# Patient Record
Sex: Male | Born: 1990 | Race: Black or African American | Hispanic: No | Marital: Married | Smoking: Never smoker
Health system: Southern US, Community
[De-identification: ages and names within clinical notes are randomized; demographics above are authoritative.]

## PROBLEM LIST (undated history)

## (undated) DIAGNOSIS — T8859XA Other complications of anesthesia, initial encounter: Secondary | ICD-10-CM

## (undated) DIAGNOSIS — I4581 Long QT syndrome: Secondary | ICD-10-CM

## (undated) HISTORY — DX: Long QT syndrome: I45.81

## (undated) HISTORY — PX: CIRCUMCISION: SUR203

---
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
## 2018-03-13 ENCOUNTER — Encounter: Payer: Self-pay | Admitting: Family Medicine

## 2018-03-13 ENCOUNTER — Ambulatory Visit: Payer: Medicaid Other | Admitting: Family Medicine

## 2018-03-13 ENCOUNTER — Other Ambulatory Visit (HOSPITAL_COMMUNITY)
Admission: RE | Admit: 2018-03-13 | Discharge: 2018-03-13 | Disposition: A | Discharge status: Home / Self Care | Payer: Medicaid Other | Source: Ambulatory Visit | Attending: Family Medicine | Admitting: Family Medicine

## 2018-03-13 VITALS — BP 110/68 | HR 70 | Temp 98.5°F | Resp 18 | Ht 74.0 in | Wt 251.2 lb

## 2018-03-13 DIAGNOSIS — Z0000 Encounter for general adult medical examination without abnormal findings: Secondary | ICD-10-CM | POA: Insufficient documentation

## 2018-03-13 DIAGNOSIS — Z6834 Body mass index (BMI) 34.0-34.9, adult: Secondary | ICD-10-CM | POA: Insufficient documentation

## 2018-03-13 DIAGNOSIS — E669 Obesity, unspecified: Secondary | ICD-10-CM | POA: Insufficient documentation

## 2018-03-13 DIAGNOSIS — E6609 Other obesity due to excess calories: Secondary | ICD-10-CM | POA: Insufficient documentation

## 2018-03-13 DIAGNOSIS — Z113 Encounter for screening for infections with a predominantly sexual mode of transmission: Secondary | ICD-10-CM | POA: Insufficient documentation

## 2018-03-13 DIAGNOSIS — Z6832 Body mass index (BMI) 32.0-32.9, adult: Secondary | ICD-10-CM

## 2018-03-13 DIAGNOSIS — Z114 Encounter for screening for human immunodeficiency virus [HIV]: Secondary | ICD-10-CM

## 2018-03-13 DIAGNOSIS — R9431 Abnormal electrocardiogram [ECG] [EKG]: Secondary | ICD-10-CM | POA: Diagnosis not present

## 2018-03-13 DIAGNOSIS — Z13220 Encounter for screening for lipoid disorders: Secondary | ICD-10-CM | POA: Diagnosis not present

## 2018-03-13 HISTORY — DX: Abnormal electrocardiogram (ECG) (EKG): R94.31

## 2018-03-13 NOTE — Progress Notes (Addendum)
Name: Mathew Doyle   MRN: [id number]    DOB: 06/02/1990   Date:03/13/2018       Progress Note  Subjective  Chief Complaint  Chief Complaint  Patient presents with  . Establish Care  . Annual Exam    HPI  Patient presents for annual CPE.  USPSTF grade A and B recommendations:  Diet: Very poor diet.  Exercise: Works as a Advertising account executivemarble installer; otherwise no exercise  Depression:  Depression screen PHQ 2/9 03/13/2018  Decreased Interest 0  Down, Depressed, Hopeless 0  PHQ - 2 Score 0  Altered sleeping 0  Tired, decreased energy 0  Change in appetite 0  Feeling bad or failure about yourself  0  Trouble concentrating 0  Moving slowly or fidgety/restless 0  Suicidal thoughts 0  PHQ-9 Score 0  Difficult doing work/chores Not difficult at all   Hypertension:  BP Readings from Last 3 Encounters:  03/13/18 110/68   Obesity: Wt Readings from Last 3 Encounters:  03/13/18 251 lb 3.2 oz (113.9 kg)   BMI Readings from Last 3 Encounters:  03/13/18 32.25 kg/m    Lipids & Glucose: We will check today    [facility]  AUDIT-C Score  1     Married STD testing and prevention (HIV/chl/gon/syphilis): No new sexual partners in the last year.  We will check today. Hep C: We will check today  Skin cancer: No concerns Colorectal cancer: Denies family or personal history of colorectal cancer, no changes in BM's - no blood in stool, dark and tarry stool, mucus in stool, or constipation/diarrhea. Prostate cancer: Maternal grandfather. Testicular Cancer: No family history; no personal history  Lung cancer: Never smoker; Low Dose CT Chest recommended if Age 27-80 years, 30 pack-year currently smoking OR have quit w/in 15years. Patient does not qualify.   AAA: Not indicated The USPSTF recommends one-time screening with ultrasonography in men ages 6465 to 4975 years who have ever smoked  ECG: Has hx Long QT syndrome with onset in gradeschool -  was taking medication for this as a child, but stopped several years ago.  He reports intermittent LEFT sided chest pain - no particular pattern since grade school as well.  Episodes have worsened slightly over the last few months.  Has not been to see cardiologist in many years.  Advanced Care Planning: A voluntary discussion about advance care planning including the explanation and discussion of advance directives.  Discussed health care proxy and Living will, and the patient was able to identify a health care proxy as Loney LaurenceChristine Reine (Wife).  Patient does not have a living will at present time. If patient does have living will, I have requested they bring this to the clinic to be scanned in to their chart.  There are no active problems to display for this patient.   Past Surgical History:  Procedure Laterality Date  . CIRCUMCISION    . MANDIBLE FRACTURE SURGERY  2010    Family History  Problem Relation Age of Onset  . Long QT syndrome Father   . Heart disease Father   . Prostate cancer Maternal Grandfather     Social History   Socioeconomic History  . Marital status: Married    Spouse name: christine  . Number of children: 2  . Years of education: Not on file  . Highest education level: Not on file  Occupational History  . Occupation: Advertising account executivemarble installer  Social Needs  . Financial resource strain: Not  hard at all  . Food insecurity:    Worry: Never true    Inability: Never true  . Transportation needs:    Medical: No    Non-medical: No  Tobacco Use  . Smoking status: Former Smoker    Types: Cigarettes  . Smokeless tobacco: Never Used  Substance and Sexual Activity  . Alcohol use: Yes    Comment: occasional  . Drug use: Never  . Sexual activity: Yes    Partners: Female  Lifestyle  . Physical activity:    Days per week: 0 days    Minutes per session: 0 min  . Stress: Not at all  Relationships  . Social connections:    Talks on phone: More than three times a week     Gets together: More than three times a week    Attends religious service: Never    Active member of club or organization: No    Attends meetings of clubs or organizations: Never    Relationship status: Married  . Intimate partner violence:    Fear of current or ex partner: No    Emotionally abused: No    Physically abused: No    Forced sexual activity: No  Other Topics Concern  . Not on file  Social History Narrative  . Not on file    No current outpatient medications on file.  No Known Allergies   ROS  Constitutional: Negative for fever or weight change.  Respiratory: Negative for cough and shortness of breath.   Cardiovascular: See HPI  Gastrointestinal: Negative for abdominal pain, no bowel changes.  Musculoskeletal: Negative for gait problem or joint swelling.  Skin: Negative for rash.  Neurological: Negative for dizziness or headache.  No other specific complaints in a complete review of systems (except as listed in HPI above).   Objective  Vitals:   03/13/18 0919  BP: 110/68  Pulse: 70  Resp: 18  Temp: 98.5 F (36.9 C)  TempSrc: Oral  SpO2: 99%  Weight: 251 lb 3.2 oz (113.9 kg)  Height: 6\' 2"  (1.88 m)    Body mass index is 32.25 kg/m.  Physical Exam  Constitutional: Patient appears well-developed and well-nourished. No distress.  HENT: Head: Normocephalic and atraumatic. Ears: B TMs ok, no erythema or effusion; Nose: Nose normal. Mouth/Throat: Oropharynx is clear and moist. No oropharyngeal exudate.  Eyes: Conjunctivae and EOM are normal. Pupils are equal, round, and reactive to light. No scleral icterus.  Neck: Normal range of motion. Neck supple. No JVD present. No thyromegaly present.  Cardiovascular: Normal rate, regular rhythm and normal heart sounds.  No murmur heard. No BLE edema. Pulmonary/Chest: Effort normal and breath sounds normal. No respiratory distress. Abdominal: Soft. Bowel sounds are normal, no distension. There is no tenderness.  no masses MALE GENITALIA: deferred RECTAL: Deferred Musculoskeletal: Normal range of motion, no joint effusions. No gross deformities Neurological: he is alert and oriented to person, place, and time. No cranial nerve deficit. Coordination, balance, strength, speech and gait are normal.  Skin: Skin is warm and dry. No rash noted. No erythema.  Psychiatric: Patient has a normal mood and affect. behavior is normal. Judgment and thought content normal.  No results found for this or any previous visit (from the past 2160 hour(s)).  PHQ2/9: Depression screen [facility] 2/9 03/13/2018  Decreased Interest 0  Down, Depressed, Hopeless 0  PHQ - 2 Score 0  Altered sleeping 0  Tired, decreased energy 0  Change in appetite 0  Feeling bad or failure  about yourself  0  Trouble concentrating 0  Moving slowly or fidgety/restless 0  Suicidal thoughts 0  PHQ-9 Score 0  Difficult doing work/chores Not difficult at all   Fall Risk: Fall Risk  03/13/2018  Falls in the past year? 0  Number falls in past yr: 0  Injury with Fall? 0  Follow up Falls evaluation completed    Assessment & Plan  1. Annual physical exam -Prostate cancer screening and PSA options (with potential risks and benefits of testing vs not testing) were discussed along with recent recs/guidelines. -USPSTF grade A and B recommendations reviewed with patient; age-appropriate recommendations, preventive care, screening tests, etc discussed and encouraged; healthy living encouraged; see AVS for patient education given to patient -Discussed importance of 150 minutes of physical activity weekly, eat two servings of fish weekly, eat one serving of tree nuts ( cashews, pistachios, pecans, almonds.Marland Kitchen) every other day, eat 6 servings of fruit/vegetables daily and drink plenty of water and avoid sweet beverages.  - EKG 12-Lead - Lipid panel - COMPLETE METABOLIC PANEL WITH GFR; Future - Urine cytology ancillary only - HIV Antibody (routine testing w  rflx) - RPR  2. Long QT interval - EKG 12-Lead - QT is prolonged and ST segment is abnormal - we will fax to Cardiology to determine urgency of referral.  - Lipid panel - COMPLETE METABOLIC PANEL WITH GFR; Future  3. Class 1 obesity due to excess calories without serious comorbidity with body mass index (BMI) of 32.0 to 32.9 in adult - Lipid panel - COMPLETE METABOLIC PANEL WITH GFR; Future  4. Lipid screening - Lipid panel  5. Screening for HIV without presence of risk factors - HIV Antibody (routine testing w rflx)  6. Routine screening for STI (sexually transmitted infection) - Urine cytology ancillary only - RPR

## 2018-03-13 NOTE — Patient Instructions (Signed)
Preventive Care 18-39 Years, Male Preventive care refers to lifestyle choices and visits with your health care provider that can promote health and wellness. What does preventive care include?   A yearly physical exam. This is also called an annual well check.  Dental exams once or twice a year.  Routine eye exams. Ask your health care provider how often you should have your eyes checked.  Personal lifestyle choices, including: ? Daily care of your teeth and gums. ? Regular physical activity. ? Eating a healthy diet. ? Avoiding tobacco and drug use. ? Limiting alcohol use. ? Practicing safe sex. What happens during an annual well check? The services and screenings done by your health care provider during your annual well check will depend on your age, overall health, lifestyle risk factors, and family history of disease. Counseling Your health care provider may ask you questions about your:  Alcohol use.  Tobacco use.  Drug use.  Emotional well-being.  Home and relationship well-being.  Sexual activity.  Eating habits.  Work and work environment. Screening You may have the following tests or measurements:  Height, weight, and BMI.  Blood pressure.  Lipid and cholesterol levels. These may be checked every 5 years starting at age 20.  Diabetes screening. This is done by checking your blood sugar (glucose) after you have not eaten for a while (fasting).  Skin check.  Hepatitis C blood test.  Hepatitis B blood test.  Sexually transmitted disease (STD) testing. Discuss your test results, treatment options, and if necessary, the need for more tests with your health care provider. Vaccines Your health care provider may recommend certain vaccines, such as:  Influenza vaccine. This is recommended every year.  Tetanus, diphtheria, and acellular pertussis (Tdap, Td) vaccine. You may need a Td booster every 10 years.  Varicella vaccine. You may need this if you  have not been vaccinated.  HPV vaccine. If you are 26 or younger, you may need three doses over 6 months.  Measles, mumps, and rubella (MMR) vaccine. You may need at least one dose of MMR.You may also need a second dose.  Pneumococcal 13-valent conjugate (PCV13) vaccine. You may need this if you have certain conditions and have not been vaccinated.  Pneumococcal polysaccharide (PPSV23) vaccine. You may need one or two doses if you smoke cigarettes or if you have certain conditions.  Meningococcal vaccine. One dose is recommended if you are age 19-21 years and a first-year college student living in a residence hall, or if you have one of several medical conditions. You may also need additional booster doses.  Hepatitis A vaccine. You may need this if you have certain conditions or if you travel or work in places where you may be exposed to hepatitis A.  Hepatitis B vaccine. You may need this if you have certain conditions or if you travel or work in places where you may be exposed to hepatitis B.  Haemophilus influenzae type b (Hib) vaccine. You may need this if you have certain risk factors. Talk to your health care provider about which screenings and vaccines you need and how often you need them. This information is not intended to replace advice given to you by your health care provider. Make sure you discuss any questions you have with your health care provider. Document Released: 04/05/2001 Document Revised: 09/20/2016 Document Reviewed: 12/09/2014 Elsevier Interactive Patient Education  2019 Elsevier Inc.   

## 2018-03-13 NOTE — Addendum Note (Signed)
Addended by: Cynda Familia on: 03/13/2018 10:09 AM   Modules accepted: Orders

## 2018-03-14 LAB — COMPLETE METABOLIC PANEL WITH GFR
AG Ratio: 1.3 (calc) (ref 1.0–2.5)
ALT: 31 U/L (ref 9–46)
AST: 25 U/L (ref 10–40)
Albumin: 4.3 g/dL (ref 3.6–5.1)
Alkaline phosphatase (APISO): 72 U/L (ref 40–115)
BUN: 16 mg/dL (ref 7–25)
CO2: 29 mmol/L (ref 20–32)
Calcium: 9.6 mg/dL (ref 8.6–10.3)
Chloride: 105 mmol/L (ref 98–110)
Creat: 0.99 mg/dL (ref 0.60–1.35)
GFR, Est African American: 122 mL/min/{1.73_m2} (ref >=60)
GFR, Est Non African American: 105 mL/min/{1.73_m2} (ref >=60)
Globulin: 3.2 g/dL (calc) (ref 1.9–3.7)
Glucose, Bld: 84 mg/dL (ref 65–99)
POTASSIUM: 4.6 mmol/L (ref 3.5–5.3)
Sodium: 140 mmol/L (ref 135–146)
Total Bilirubin: 0.6 mg/dL (ref 0.2–1.2)
Total Protein: 7.5 g/dL (ref 6.1–8.1)

## 2018-03-14 LAB — LIPID PANEL
Cholesterol: 128 mg/dL (ref <200)
HDL: 41 mg/dL (ref >40)
LDL Cholesterol (Calc): 74 mg/dL (calc)
Non-HDL Cholesterol (Calc): 87 mg/dL (calc) (ref <130)
TRIGLYCERIDES: 51 mg/dL (ref <150)
Total CHOL/HDL Ratio: 3.1 (calc) (ref <5.0)

## 2018-03-14 LAB — HIV ANTIBODY (ROUTINE TESTING W REFLEX): HIV 1&2 Ab, 4th Generation: NONREACTIVE

## 2018-03-14 LAB — RPR: RPR: NONREACTIVE

## 2018-03-15 LAB — URINE CYTOLOGY ANCILLARY ONLY
Chlamydia: NEGATIVE
Neisseria Gonorrhea: NEGATIVE

## 2018-06-21 ENCOUNTER — Encounter: Payer: Self-pay | Admitting: Family Medicine

## 2018-06-21 ENCOUNTER — Other Ambulatory Visit: Payer: Self-pay

## 2018-06-21 ENCOUNTER — Ambulatory Visit (INDEPENDENT_AMBULATORY_CARE_PROVIDER_SITE_OTHER): Payer: Medicaid Other | Admitting: Family Medicine

## 2018-06-21 ENCOUNTER — Ambulatory Visit
Admission: EM | Admit: 2018-06-21 | Discharge: 2018-06-21 | Disposition: A | Discharge status: Home / Self Care | Payer: Medicaid Other | Attending: Family Medicine | Admitting: Family Medicine

## 2018-06-21 DIAGNOSIS — R9431 Abnormal electrocardiogram [ECG] [EKG]: Secondary | ICD-10-CM | POA: Diagnosis not present

## 2018-06-21 DIAGNOSIS — R0789 Other chest pain: Secondary | ICD-10-CM | POA: Insufficient documentation

## 2018-06-21 DIAGNOSIS — R079 Chest pain, unspecified: Secondary | ICD-10-CM

## 2018-06-21 LAB — COMPREHENSIVE METABOLIC PANEL
ALT: 24 U/L (ref 0–44)
AST: 22 U/L (ref 15–41)
Albumin: 4.6 g/dL (ref 3.5–5.0)
Alkaline Phosphatase: 75 U/L (ref 38–126)
Anion gap: 5 (ref 5–15)
BUN: 20 mg/dL (ref 6–20)
CO2: 26 mmol/L (ref 22–32)
Calcium: 9.6 mg/dL (ref 8.9–10.3)
Chloride: 107 mmol/L (ref 98–111)
Creatinine, Ser: 1.05 mg/dL (ref 0.61–1.24)
GFR calc Af Amer: >60 mL/min (ref >60)
GFR calc non Af Amer: >60 mL/min (ref >60)
Glucose, Bld: 91 mg/dL (ref 70–99)
Potassium: 3.8 mmol/L (ref 3.5–5.1)
Sodium: 138 mmol/L (ref 135–145)
Total Bilirubin: 0.4 mg/dL (ref 0.3–1.2)
Total Protein: 8.4 g/dL — ABNORMAL HIGH (ref 6.5–8.1)

## 2018-06-21 LAB — CBC WITH DIFFERENTIAL/PLATELET
Abs Immature Granulocytes: 0.01 10*3/uL (ref 0.00–0.07)
Basophils Absolute: 0.1 10*3/uL (ref 0.0–0.1)
Basophils Relative: 1 %
Eosinophils Absolute: 0 10*3/uL (ref 0.0–0.5)
Eosinophils Relative: 1 %
HCT: 43.8 % (ref 39.0–52.0)
Hemoglobin: 15.5 g/dL (ref 13.0–17.0)
Immature Granulocytes: 0 %
Lymphocytes Relative: 47 %
Lymphs Abs: 3.1 10*3/uL (ref 0.7–4.0)
MCH: 29.6 pg (ref 26.0–34.0)
MCHC: 35.4 g/dL (ref 30.0–36.0)
MCV: 83.7 fL (ref 80.0–100.0)
Monocytes Absolute: 0.5 10*3/uL (ref 0.1–1.0)
Monocytes Relative: 8 %
Neutro Abs: 2.8 10*3/uL (ref 1.7–7.7)
Neutrophils Relative %: 43 %
Platelets: 234 10*3/uL (ref 150–400)
RBC: 5.23 MIL/uL (ref 4.22–5.81)
RDW: 12.8 % (ref 11.5–15.5)
WBC: 6.4 10*3/uL (ref 4.0–10.5)
nRBC: 0 % (ref 0.0–0.2)

## 2018-06-21 LAB — TROPONIN I: Troponin I: <0.03 ng/mL (ref <0.03)

## 2018-06-21 NOTE — Progress Notes (Signed)
Name: Mathew Doyle   MRN: [id number]    DOB: [birth year]   Date:06/21/2018       Progress Note  Subjective  Chief Complaint  Chief Complaint  Patient presents with  . Chest Pain    no appetite, weak for 3 days    I connected with  Alinda Dooms  on 06/21/18 at  1:40 PM EDT by a video enabled telemedicine application and verified that I am speaking with the correct person using two identifiers.  I discussed the limitations of evaluation and management by telemedicine and the availability of in person appointments. The patient expressed understanding and agreed to proceed. Staff also discussed with the patient that there may be a patient responsible charge related to this service. Patient Location: Home Provider Location: Office Additional Individuals present: None  HPI  Pt presents with concern for chest pain for about a week.  Last visit showed long QT syndrome and abnormal ST segment, but he declined cardiology referral at that time.  He has been having diaphoresis, decreased appetite, and feeling weak, had subjective fevers.   - Chest pain is described as a "knot" that someone is squeezing inside of his chest; episodes last about a minute at a time - has several episodes a day, he is not being woken at night.  - Denies cough, shortness of breath. - Has tried Tylenol and this helped with feeling feverish, but not with anything else. 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 - Overall course is the same or worsening.  Patient Active Problem List   Diagnosis Date Noted  . 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 Class 1 obesity due to excess calories without serious comorbidity with body mass index (BMI) of 32.0 to 32.9 in adult 03/13/2018  . Lipid screening 03/13/2018    Social History   Tobacco Use  . Smoking status: Former Smoker    Types: Cigarettes  . Smokeless tobacco: Never Used  Substance Use Topics  . Alcohol use: Yes    Comment: occasional     No current outpatient medications on file.  No Known Allergies  I personally reviewed active problem list, medication list, allergies, notes from last encounter, lab results with the patient/caregiver today.  ROS  Ten systems reviewed and is negative except as mentioned in HPI   Objective  Virtual encounter, vitals not obtained.  There is no height or weight on file to calculate BMI.  Nursing Note and Vital Signs reviewed.  Physical Exam  Constitutional: Patient appears well-developed and well-nourished. No distress.  HENT: Head: Normocephalic and atraumatic.  Neck: Normal range of motion. Pulmonary/Chest: Effort normal. No respiratory distress. Speaking in complete sentences Neurological: Pt is alert and oriented to person, place, and time. Coordination, speech and gait are normal.  Psychiatric: Patient has a normal mood and affect. behavior is normal. Judgment and thought content normal.  No results found for this or any previous visit (from the past 72 hour(s)).  Assessment & Plan  1. Long QT interval - Ambulatory referral to Cardiology  2. Chest pain, unspecified type - Strongly recommend Urgent Care today for evaluation - hesitant at first, but willing to go.  - Ambulatory referral to Cardiology  -Red flags and when to present for emergency care or RTC including fever >101.43F, chest pain, shortness of breath, new/worsening/un-resolving symptoms, reviewed with patient at time of visit. Follow up and care instructions discussed and provided in AVS. - I discussed the assessment and treatment plan  with the patient. The patient was provided an opportunity to ask questions and all were answered. The patient agreed with the plan and demonstrated an understanding of the instructions.  I provided 21 minutes of non-face-to-face time during this encounter.  Doren CustardEmily E Sorayah Schrodt, FNP

## 2018-06-21 NOTE — Discharge Instructions (Addendum)
Labs and EKG were normal today.   Rest.  See Cardiology on Monday.  Take care  Dr. Adriana Simas

## 2018-06-21 NOTE — ED Triage Notes (Signed)
Patient complains of chest pain that has been off and on over the last week.

## 2018-06-21 NOTE — ED Provider Notes (Signed)
[facility]    CSN: [id number] Arrival date & time: 06/21/18  1513  History   Chief Complaint Chief Complaint  Patient presents with  . Chest Pain   HPI  28 year old male presents with chest pain.  Patient reports that over this past week he has had intermittent, left-sided chest pain.  Patient describes the pain as a "knot" on the left side of his chest.  Patient states that it is moderate to severe and occurs.  Lasts about a minute and then self resolves.  Patient reports that he has recently had subjective fever.  Patient endorsing diaphoresis.  He states that this lasted for a few days and then resolved.  Reports that he had an episode of shortness of breath yesterday with lifting a heavy object at work.  No other reported bouts of shortness of breath.  No cough.  Chest pain not associated with exertion.  No relieving factors.  No other reported symptoms.  No other complaints or concerns at this time.  PMH, Surgical Hx, Family Hx, Social History reviewed and updated as below.  Past Medical History:  Diagnosis Date  . Long Q-T syndrome    Patient Active Problem List   Diagnosis Date Noted  . 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 Class 1 obesity due to excess calories without serious comorbidity with body mass index (BMI) of 32.0 to 32.9 in adult 03/13/2018  . Lipid screening 03/13/2018   Past Surgical History:  Procedure Laterality Date  . CIRCUMCISION    . MANDIBLE FRACTURE SURGERY  2010    Home Medications    Prior to Admission medications   Not on File    Family History Family History  Problem Relation Age of Onset  . Long QT syndrome Father   . Heart disease Father   . Prostate cancer Maternal Grandfather     Social History Social History   Tobacco Use  . Smoking status: Former Smoker    Types: Cigarettes  . Smokeless tobacco: Never Used  Substance Use Topics  . Alcohol use: Yes    Comment: occasional  . Drug use: Never     Allergies    Patient has no known allergies.   Review of Systems Review of Systems  Constitutional: Positive for diaphoresis.       ? Fever.   Cardiovascular: Positive for chest pain.   Physical Exam Triage Vital Signs ED Triage Vitals  Enc Vitals Group     BP 06/21/18 1526 126/76     Pulse Rate 06/21/18 1526 (!) 56     Resp 06/21/18 1526 18     Temp 06/21/18 1526 98.9 F (37.2 C)     Temp Source 06/21/18 1526 Oral     SpO2 06/21/18 1526 99 %     Weight 06/21/18 1524 250 lb (113.4 kg)     Height 06/21/18 1524 6\' 1"  (1.854 m)     Head Circumference --      Peak Flow --      Pain Score 06/21/18 1523 6     Pain Loc --      Pain Edu? --      Excl. in GC? --    Updated Vital Signs BP 126/76 (BP Location: Right Arm)   Pulse (!) 56   Temp 98.9 F (37.2 C) (Oral)   Resp 18   Ht 6\' 1"  (1.854 m)   Wt 113.4 kg   SpO2 99%   BMI 32.98 kg/m   Visual Acuity Right  Eye Distance:   Left Eye Distance:   Bilateral Distance:    Right Eye Near:   Left Eye Near:    Bilateral Near:     Physical Exam Vitals signs and nursing note reviewed.  Constitutional:      General: He is not in acute distress.    Appearance: Normal appearance.  HENT:     Head: Normocephalic and atraumatic.     Mouth/Throat:     Pharynx: Oropharynx is clear. No posterior oropharyngeal erythema.  Eyes:     General: No scleral icterus.    Conjunctiva/sclera: Conjunctivae normal.  Cardiovascular:     Rate and Rhythm: Regular rhythm. Bradycardia present.  Pulmonary:     Effort: Pulmonary effort is normal.     Breath sounds: No wheezing or rales.  Chest:     Chest wall: No tenderness.  Neurological:     Mental Status: He is alert.  Psychiatric:        Mood and Affect: Mood normal.        Behavior: Behavior normal.    UC Treatments / Results  Labs (all labs ordered are listed, but only abnormal results are displayed) Labs Reviewed  COMPREHENSIVE METABOLIC PANEL - Abnormal; Notable for the following  components:      Result Value   Total Protein 8.4 (*)    All other components within normal limits  CBC WITH DIFFERENTIAL/PLATELET  TROPONIN I    EKG Interpretation: Sinus bradycardia with sinus arrhythmia, rate 57.  QTc 449.  Improved from prior EKG.  No evidence of prolonged QT at this time.  No ST or T wave changes suggestive of ischemia.  Radiology No results found.  Procedures Procedures (including critical care time)  Medications Ordered in UC Medications - No data to display  Initial Impression / Assessment and Plan / UC Course  I have reviewed the triage vital signs and the nursing notes.  Pertinent labs & imaging results that were available during my care of the patient were reviewed by me and considered in my medical decision making (see chart for details).    29 year old male presents with chest pain.  Exam unremarkable.  He is well-appearing.  Vital signs stable.  EKG unremarkable today.  Laboratory studies including troponin normal.  Advised to rest.  Patient excused from work tomorrow.  Patient has an appointment on Monday with cardiology.    Final Clinical Impressions(s) / UC Diagnoses   Final diagnoses:  Atypical chest pain     Discharge Instructions     Labs and EKG were normal today.   Rest.  See Cardiology on Monday.  Take care  Dr. Adriana Simas     ED Prescriptions    None     Controlled Substance Prescriptions [place] Controlled Substance Registry consulted? Not Applicable   Tommie Sams, DO 06/21/18 1627

## 2018-06-22 ENCOUNTER — Encounter: Payer: Self-pay | Admitting: Emergency Medicine

## 2018-08-09 ENCOUNTER — Telehealth: Payer: Medicaid Other | Admitting: Physician Assistant

## 2018-08-09 DIAGNOSIS — Z20822 Contact with and (suspected) exposure to covid-19: Secondary | ICD-10-CM

## 2018-08-09 NOTE — Progress Notes (Signed)
E-Visit for Corona Virus Screening   We do not order testing via e-visit. Giving past symptoms and concerns I recommend you call your health care provider or local health department to request and arrange formal testing. Many health care providers can now test patients at their office but not all are.  Please quarantine yourself while awaiting your test results.  [facility] [phone], [facility] [phone], [facility] [phone] or visit [email or web address]     COVID-19 is a respiratory illness with symptoms that are similar to the flu. Symptoms are typically mild to moderate, but there have been cases of severe illness and death due to the virus. The following symptoms may appear 2-14 days after exposure: . Fever . Cough . Shortness of breath or difficulty breathing . Chills . Repeated shaking with chills . Muscle pain . Headache . Sore throat . New loss of taste or smell . Fatigue . Congestion or runny nose . Nausea or vomiting . Diarrhea  It is vitally important that if you feel that you have an infection such as this virus or any other virus that you stay home and away from places where you may spread it to others.  You should self-quarantine for 14 days if you have symptoms that could potentially be coronavirus or have been in close contact a with a person diagnosed with COVID-19 within the last 2 weeks. You should avoid contact with people age 26 and older.   You should wear a mask or cloth face covering over your nose and mouth if you must be around other people or animals, including pets (even at home). Try to stay at least 6 feet away from other people. This will protect the people around you.   You may also take acetaminophen (Tylenol) as needed for fever.   Reduce your risk of any infection by using the same precautions used for avoiding the  common cold or flu:  Marland Kitchen. Wash your hands often with soap and warm water for at least 20 seconds.  If soap and water are not readily available, use an alcohol-based hand sanitizer with at least 60% alcohol.  . If coughing or sneezing, cover your mouth and nose by coughing or sneezing into the elbow areas of your shirt or coat, into a tissue or into your sleeve (not your hands). . Avoid shaking hands with others and consider head nods or verbal greetings only. . Avoid touching your eyes, nose, or mouth with unwashed hands.  . Avoid close contact with people who are sick. . Avoid places or events with large numbers of people in one location, like concerts or sporting events. . Carefully consider travel plans you have or are making. . If you are planning any travel outside or inside the [place], visit the CDC's Travelers' Health webpage for the latest health notices. . If you have some symptoms but not all symptoms, continue to monitor at home and seek medical attention if your symptoms worsen. . If you are having a medical emergency, call 911.  HOME CARE . Only take medications as instructed by your medical team. . Drink plenty of fluids and get plenty of rest. . A steam or ultrasonic humidifier can help if you have congestion.   GET HELP RIGHT AWAY IF YOU HAVE EMERGENCY WARNING SIGNS** FOR COVID-19. If you or someone is showing any of these signs seek emergency medical care immediately. Call 911 or proceed to your closest emergency facility if: . You develop  worsening high fever. . Trouble breathing . Bluish lips or face . Persistent pain or pressure in the chest . New confusion . Inability to wake or stay awake . You cough up blood. . Your symptoms become more severe  **This list is not all possible symptoms. Contact your medical provider for any symptoms that are sever or concerning to you.   MAKE SURE YOU   Understand these instructions.  Will watch your condition.  Will get help right  away if you are not doing well or get worse.  Your e-visit answers were reviewed by a board certified advanced clinical practitioner to complete your personal care plan.  Depending on the condition, your plan could have included both over the counter or prescription medications.  If there is a problem please reply once you have received a response from your provider.  Your safety is important to [place].  If you have drug allergies check your prescription carefully.    You can use MyChart to ask questions about today's visit, request a non-urgent call back, or ask for a work or school excuse for 24 hours related to this e-Visit. If it has been greater than 24 hours you will need to follow up with your provider, or enter a new e-Visit to address those concerns. You will get an e-mail in the next two days asking about your experience.  I hope that your e-visit has been valuable and will speed your recovery. Thank you for using e-visits.

## 2018-08-09 NOTE — Progress Notes (Signed)
I have spent 5 minutes in review of e-visit questionnaire, review and updating patient chart, medical decision making and response to patient.   Linzie Criss Cody Janeka Libman, PA-C    

## 2018-08-10 ENCOUNTER — Ambulatory Visit (HOSPITAL_COMMUNITY)
Admission: EM | Admit: 2018-08-10 | Discharge: 2018-08-10 | Disposition: A | Discharge status: Home / Self Care | Payer: Medicaid Other | Attending: Internal Medicine | Admitting: Internal Medicine

## 2018-08-10 ENCOUNTER — Other Ambulatory Visit: Payer: Medicaid Other

## 2018-08-10 ENCOUNTER — Telehealth: Payer: Self-pay | Admitting: General Practice

## 2018-08-10 ENCOUNTER — Encounter (HOSPITAL_COMMUNITY): Payer: Self-pay

## 2018-08-10 ENCOUNTER — Other Ambulatory Visit: Payer: Self-pay

## 2018-08-10 DIAGNOSIS — Z7689 Persons encountering health services in other specified circumstances: Secondary | ICD-10-CM | POA: Diagnosis not present

## 2018-08-10 DIAGNOSIS — Z20822 Contact with and (suspected) exposure to covid-19: Secondary | ICD-10-CM

## 2018-08-10 DIAGNOSIS — R6889 Other general symptoms and signs: Secondary | ICD-10-CM | POA: Diagnosis not present

## 2018-08-10 NOTE — Discharge Instructions (Signed)
We do recommend 10 days of isolation from onset of symptoms with improvement and no fever for at least three days without medications.  Due to your symptoms in presence of pandemic I do recommend Covid-19 testing. You will be called to set up an appointment time to be tested at our [facility]. Results take about 2-3 days.  Please self isolate until you receive this results.   If any worsening of symptoms please return or go to the ER.

## 2018-08-10 NOTE — Addendum Note (Signed)
Addended by: Matilde Sprang on: 08/10/2018 09:38 AM   Modules accepted: Orders

## 2018-08-10 NOTE — Telephone Encounter (Signed)
Pt has been scheduled for covid testing. Pt was referred by: Zigmund Gottron, NP

## 2018-08-10 NOTE — ED Provider Notes (Signed)
[facility]    CSN: [id number] Arrival date & time: 08/10/18  0830      History   Chief Complaint Chief Complaint  Patient presents with  . Work Note    HPI Mathew Doyle is a 28 y.o. male.   Mathew Doyle presents with complaints of recent illness. Started feeling ill 6/11 with chills and sweating, body aches, sore throat. Has felt better since 6/15, today feels much improved but needs a note to return to work. Denies fever since 6/15. No cough. No congestion. No ear pain. No rash. No gi/gu complaints. States he had a day when he felt like he had no taste but this has since returned and is normal. No loss of smell. No rash. Had taken tylenol and motrin, hasn't taken any medications today. No family members with illness. He works in [employer]. No others ill that he is aware of. Without contributing medical history.       ROS per HPI, negative if not otherwise mentioned.      Past Medical History:  Diagnosis Date  . Long Q-T syndrome     Patient Active Problem List   Diagnosis Date Noted  . 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 Class 1 obesity due to excess calories without serious comorbidity with body mass index (BMI) of 32.0 to 32.9 in adult 03/13/2018  . Lipid screening 03/13/2018    Past Surgical History:  Procedure Laterality Date  . CIRCUMCISION    . MANDIBLE FRACTURE SURGERY  2010       Home Medications    Prior to Admission medications   Not on File    Family History Family History  Problem Relation Age of Onset  . Long QT syndrome Father   . Heart disease Father   . Prostate cancer Maternal Grandfather     Social History Social History   Tobacco Use  . Smoking status: Former Smoker    Types: Cigarettes  . Smokeless tobacco: Never Used  Substance Use Topics  . Alcohol use: Yes    Comment: occasional  . Drug use: Never     Allergies   Patient has no known allergies.   Review of Systems Review of Systems   Physical  Exam Triage Vital Signs ED Triage Vitals  Enc Vitals Group     BP 08/10/18 0903 121/75     Pulse Rate 08/10/18 0903 (!) 58     Resp 08/10/18 0903 17     Temp 08/10/18 0903 99.2 F (37.3 C)     Temp Source 08/10/18 0903 Oral     SpO2 08/10/18 0903 96 %     Weight --      Height --      Head Circumference --      Peak Flow --      Pain Score 08/10/18 0905 0     Pain Loc --      Pain Edu? --      Excl. in [place]? --    No data found.  Updated Vital Signs BP 121/75 (BP Location: Left Arm)   Pulse (!) 58   Temp 99.2 F (37.3 C) (Oral)   Resp 17   SpO2 96%    Physical Exam Constitutional:      General: He is not in acute distress.    Appearance: He is well-developed. He is not ill-appearing or toxic-appearing.  Cardiovascular:     Rate and Rhythm: Normal rate.  Pulmonary:     Effort: Pulmonary effort  is normal.  Skin:    General: Skin is warm and dry.  Neurological:     Mental Status: He is alert and oriented to person, place, and time.      UC Treatments / Results  Labs (all labs ordered are listed, but only abnormal results are displayed) Labs Reviewed - No data to display  EKG None  Radiology No results found.  Procedures Procedures (including critical care time)  Medications Ordered in UC Medications - No data to display  Initial Impression / Assessment and Plan / UC Course  I have reviewed the triage vital signs and the nursing notes.  Pertinent labs & imaging results that were available during my care of the patient were reviewed by me and considered in my medical decision making (see chart for details).     Non toxic. Benign physical exam.  Recent febrile illness during covid-19 pandemic. 10 days self isolation recommended. [facility]. Continue with supportive cares. Return precautions provided. Patient verbalized understanding and agreeable to plan.   Final Clinical Impressions(s) / UC Diagnoses   Final  diagnoses:  Return to work evaluation  Suspected Covid-19 Virus Infection     Discharge Instructions     We do recommend 10 days of isolation from onset of symptoms with improvement and no fever for at least three days without medications.  Due to your symptoms in presence of pandemic I do recommend Covid-19 testing. You will be called to set up an appointment time to be [facility]. Results take about 2-3 days.  Please self isolate until you receive this results.   If any worsening of symptoms please return or go to the ER.    ED Prescriptions    None     Controlled Substance Prescriptions  Controlled Substance Registry consulted? Not Applicable   Georgetta HaberBurky,  B, NP 08/10/18 [phone]

## 2018-08-10 NOTE — Telephone Encounter (Signed)
-----   Message from Zigmund Gottron, NP sent at 08/10/2018  9:23 AM EDT ----- Regarding: covid testing needed

## 2018-08-10 NOTE — Telephone Encounter (Signed)
Order placed for covid testing

## 2018-08-10 NOTE — ED Triage Notes (Signed)
Pt presents for note to return to work after beinf sick for a few days.

## 2018-08-15 LAB — NOVEL CORONAVIRUS, NAA: SARS-CoV-2, NAA: NOT DETECTED

## 2018-08-20 ENCOUNTER — Other Ambulatory Visit: Payer: Self-pay

## 2018-08-20 ENCOUNTER — Encounter (HOSPITAL_COMMUNITY): Payer: Self-pay | Admitting: Emergency Medicine

## 2018-08-20 ENCOUNTER — Emergency Department (HOSPITAL_COMMUNITY)
Admission: EM | Admit: 2018-08-20 | Discharge: 2018-08-20 | Disposition: A | Discharge status: Home / Self Care | Payer: Medicaid Other | Attending: Emergency Medicine | Admitting: Emergency Medicine

## 2018-08-20 ENCOUNTER — Ambulatory Visit (HOSPITAL_COMMUNITY)
Admission: EM | Admit: 2018-08-20 | Discharge: 2018-08-20 | Disposition: A | Discharge status: Home / Self Care | Payer: Medicaid Other | Attending: Family Medicine | Admitting: Family Medicine

## 2018-08-20 DIAGNOSIS — R001 Bradycardia, unspecified: Secondary | ICD-10-CM

## 2018-08-20 DIAGNOSIS — R079 Chest pain, unspecified: Secondary | ICD-10-CM

## 2018-08-20 DIAGNOSIS — Z87891 Personal history of nicotine dependence: Secondary | ICD-10-CM | POA: Diagnosis not present

## 2018-08-20 DIAGNOSIS — R0789 Other chest pain: Secondary | ICD-10-CM

## 2018-08-20 LAB — CBC
HCT: 42.8 % (ref 39.0–52.0)
Hemoglobin: 14.6 g/dL (ref 13.0–17.0)
MCH: 28.7 pg (ref 26.0–34.0)
MCHC: 34.1 g/dL (ref 30.0–36.0)
MCV: 84.1 fL (ref 80.0–100.0)
Platelets: 302 10*3/uL (ref 150–400)
RBC: 5.09 MIL/uL (ref 4.22–5.81)
RDW: 12.3 % (ref 11.5–15.5)
WBC: 7.2 10*3/uL (ref 4.0–10.5)
nRBC: 0 % (ref 0.0–0.2)

## 2018-08-20 LAB — BASIC METABOLIC PANEL
Anion gap: 9 (ref 5–15)
BUN: 16 mg/dL (ref 6–20)
CO2: 23 mmol/L (ref 22–32)
Calcium: 9.5 mg/dL (ref 8.9–10.3)
Chloride: 106 mmol/L (ref 98–111)
Creatinine, Ser: 1.05 mg/dL (ref 0.61–1.24)
GFR calc Af Amer: >60 mL/min (ref >60)
GFR calc non Af Amer: >60 mL/min (ref >60)
Glucose, Bld: 89 mg/dL (ref 70–99)
Potassium: 4.3 mmol/L (ref 3.5–5.1)
Sodium: 138 mmol/L (ref 135–145)

## 2018-08-20 LAB — TROPONIN I (HIGH SENSITIVITY): Troponin I (High Sensitivity): <2 ng/L (ref <18)

## 2018-08-20 MED ORDER — DICLOFENAC SODIUM 1 % TD GEL: 2.0000 g | Freq: Four times a day (QID) | TRANSDERMAL | 0 refills | Status: DC | PRN

## 2018-08-20 NOTE — ED Triage Notes (Signed)
Pt sts pain in chest area after lifting something heavy today at work that is now resolved

## 2018-08-20 NOTE — ED Provider Notes (Signed)
[facility]    CSN: [id number] Arrival date & time: 08/20/18  [id number]     History   Chief Complaint Chief Complaint  Patient presents with  . Muscle Pain    HPI Mathew Doyle is a 28 y.o. male.   HPI Patient states he has a known "heart condition".  He previously lived in [place].  In [place] he states he had heart testing including a stress test.  He works in [employer].  He is usually able to exert himself without difficulty.  He had chest pain in April of this year and was seen at the emergency room.  Work-up was negative.  He followed up with cardiology.  They ordered an echocardiogram, this has not been done.  They ordered a Holter monitor.  He states that he had this done, but he never got test results.  He states that he thinks is cardiac work-up is delayed because of the COVID-19 pandemic. Today while at work he states that he was lifting wood up onto his shoulder and moving it to another area.  The exertion was moderate.  While he was doing this he developed rapid heartbeat "tachycardia".  He states that his heart felt like it was beating fast and beating hard, like it was "beating out of my chest".  He states that this caused a squeezing chest pain in the center of his chest and a feeling of shortness of breath.  He had to sit down.  He states it lasted almost 20 minutes.  He is here because he is worried about his heart.  He thinks he has some yet undiagnosed heart condition that could be dangerous.  He feels well now. Past Medical History:  Diagnosis Date  . Long Q-T syndrome     Patient Active Problem List   Diagnosis Date Noted  . 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 Class 1 obesity due to excess calories without serious comorbidity with body mass index (BMI) of 32.0 to 32.9 in adult 03/13/2018  . Lipid screening 03/13/2018    Past Surgical History:  Procedure Laterality Date  . CIRCUMCISION    . MANDIBLE FRACTURE SURGERY  2010       Home Medications     Prior to Admission medications   Medication Sig Start Date End Date Taking? Authorizing Provider  Multiple Vitamin (MULTIVITAMIN WITH MINERALS) TABS tablet Take 1 tablet by mouth daily.    [provider]    Family History Family History  Problem Relation Age of Onset  . Long QT syndrome Father   . Heart disease Father   . Prostate cancer Maternal Grandfather     Social History Social History   Tobacco Use  . Smoking status: Former Smoker    Types: Cigarettes  . Smokeless tobacco: Never Used  Substance Use Topics  . Alcohol use: Yes    Comment: occasional  . Drug use: Never     Allergies   Patient has no known allergies.   Review of Systems Review of Systems  Constitutional: Negative for chills and fever.  HENT: Negative for ear pain and sore throat.   Eyes: Negative for pain and visual disturbance.  Respiratory: Positive for shortness of breath. Negative for cough.   Cardiovascular: Positive for chest pain and palpitations. Negative for leg swelling.  Gastrointestinal: Negative for abdominal pain and vomiting.  Genitourinary: Negative for dysuria and hematuria.  Musculoskeletal: Negative for arthralgias and back pain.  Skin: Negative for color change and rash.  Neurological: Negative for seizures and syncope.  All other systems reviewed and are negative.    Physical Exam Triage Vital Signs ED Triage Vitals [08/20/18 1043]  Enc Vitals Group     BP 119/68     Pulse Rate (!) 52     Resp 18     Temp 98 F (36.7 C)     Temp Source Oral     SpO2 100 %     Weight      Height      Head Circumference      Peak Flow      Pain Score 0     Pain Loc      Pain Edu?      Excl. in [place]?    No data found.  Updated Vital Signs BP 119/68 (BP Location: Right Arm)   Pulse (!) 52   Temp 98 F (36.7 C) (Oral)   Resp 18   SpO2 100%       Physical Exam Constitutional:      General: He is not in acute distress.    Appearance: He is well-developed and  normal weight.  HENT:     Head: Normocephalic and atraumatic.     Nose: Nose normal.     Mouth/Throat:     Pharynx: No posterior oropharyngeal erythema.  Eyes:     Conjunctiva/sclera: Conjunctivae normal.     Pupils: Pupils are equal, round, and reactive to light.  Neck:     Musculoskeletal: Normal range of motion and neck supple.     Vascular: No carotid bruit.  Cardiovascular:     Rate and Rhythm: Regular rhythm. Bradycardia present.     Heart sounds: Normal heart sounds.  Pulmonary:     Effort: Pulmonary effort is normal. No respiratory distress.     Breath sounds: Normal breath sounds.  Abdominal:     General: Abdomen is flat. There is no distension.     Palpations: Abdomen is soft. There is no mass.     Tenderness: There is no abdominal tenderness.  Musculoskeletal: Normal range of motion.     Right lower leg: No edema.     Left lower leg: No edema.  Skin:    General: Skin is warm and dry.  Neurological:     Mental Status: He is alert.      UC Treatments / Results  Labs (all labs ordered are listed, but only abnormal results are displayed) Labs Reviewed - No data to display  EKG EKG shows sinus bradycardia.  Otherwise normal, without ST changes None  Radiology No results found.  Procedures Procedures (including critical care time)  Medications Ordered in UC Medications - No data to display  Initial Impression / Assessment and Plan / UC Course  I have reviewed the triage vital signs and the nursing notes.  Pertinent labs & imaging results that were available during my care of the patient were reviewed by me and considered in my medical decision making (see chart for details).     I explained that the patient need to be seen in the emergency room if he wanted more evaluation of his chest pain.  His EKG is normal.  I cannot do the blood testing that would be required to tell whether his chest pain today was cardiac related. Final Clinical Impressions(s) /  UC Diagnoses   Final diagnoses:  Chest pain, unspecified type     Discharge Instructions     Need to go to the ER  for evaluation of chest pain    ED Prescriptions    None     Controlled Substance Prescriptions [place] Controlled Substance Registry consulted? Not Applicable   Eustace MooreNelson, Lataisha Colan Sue, MD 08/20/18 [phone]

## 2018-08-20 NOTE — ED Notes (Signed)
Pt ambulates to bathroom with steady gait

## 2018-08-20 NOTE — ED Triage Notes (Addendum)
Pt went to UC today for pain in left chest after lifting something heavy.  States pain has resolved.  HR at UC was 48, sent here for further eval. Has hx of prolonged QT.

## 2018-08-20 NOTE — ED Provider Notes (Signed)
[facility] Provider Note   CSN: [id number] Arrival date & time: 08/20/18  1147    History   Chief Complaint Chief Complaint  Patient presents with  . Chest Pain    HPI Mathew Doyle is a 28 y.o. male presenting for evaluation of CP.  Patient states approximately 4 hours prior to arrival he had an episode of sharp left-sided chest pain.  It lasted for 10 to 15 minutes before resolving without intervention.  Patient reports pain was severe, brought him to his knees.  Due to the pain, he went to urgent care for further evaluation.  He sent to the ER for an abnormal EKG.  Patient states he has had cardiac work-up since he was a child due to prolonged QT.  Per chart review, patient is always bradycardic in the 50s.  Patient reports no other medical problems, he takes medications daily.  He has not followed up with cardiology since he is been in [place] or as an adult.  He denies associated shortness of breath, nausea, vomiting, or diaphoresis during the chest pain.  He denies recent fevers, chills, cough.  He denies tobacco, alcohol, or drug use.  Symptoms began while he was lifting something heavy.  Patient reports history of similar chest pain episodes, but usually not as severe.  Sometimes it occurs with exertion, but has also occurred when he has been asleep.  No recent increase in episodes of chest pain. The patient has no history of stroke, has no history of peripheral artery disease, has not smoked in the past 90 days, denies any history of treated diabetes, has no relevant family history of coronary artery disease (first degree relative at less than age 26), is not hypertensive, has no history of hypercholesterolemia and does not have an elevated BMI (>=30).   HPI  Past Medical History:  Diagnosis Date  . Long Q-T syndrome     Patient Active Problem List   Diagnosis Date Noted  . 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 Class 1 obesity due to  excess calories without serious comorbidity with body mass index (BMI) of 32.0 to 32.9 in adult 03/13/2018  . Lipid screening 03/13/2018    Past Surgical History:  Procedure Laterality Date  . CIRCUMCISION    . MANDIBLE FRACTURE SURGERY  2010        Home Medications    Prior to Admission medications   Medication Sig Start Date End Date Taking? Authorizing Provider  Multiple Vitamin (MULTIVITAMIN WITH MINERALS) TABS tablet Take 1 tablet by mouth daily.   Yes [provider]  diclofenac sodium (VOLTAREN) 1 % GEL Apply 2 g topically 4 (four) times daily as needed. 08/20/18   Breah Joa, PA-C    Family History Family History  Problem Relation Age of Onset  . Long QT syndrome Father   . Heart disease Father   . Prostate cancer Maternal Grandfather     Social History Social History   Tobacco Use  . Smoking status: Former Smoker    Types: Cigarettes  . Smokeless tobacco: Never Used  Substance Use Topics  . Alcohol use: Yes    Comment: occasional  . Drug use: Never     Allergies   Patient has no known allergies.   Review of Systems Review of Systems  Cardiovascular: Positive for chest pain.  All other systems reviewed and are negative.    Physical Exam Updated Vital Signs BP 123/76   Pulse (!) 51  Temp 98.6 F (37 C) (Oral)   Resp 19   Ht 6\' 2"  (1.88 m)   Wt 107.5 kg   SpO2 100%   BMI 30.43 kg/m   Physical Exam Vitals signs and nursing note reviewed.  Constitutional:      General: He is not in acute distress.    Appearance: He is well-developed.     Comments: Resting comfortably in the bed in no acute distress  HENT:     Head: Normocephalic and atraumatic.  Eyes:     Conjunctiva/sclera: Conjunctivae normal.     Pupils: Pupils are equal, round, and reactive to light.  Neck:     Musculoskeletal: Normal range of motion and neck supple.  Cardiovascular:     Rate and Rhythm: Normal rate and regular rhythm.  Pulmonary:     Effort:  Pulmonary effort is normal. No respiratory distress.     Breath sounds: Normal breath sounds. No wheezing.     Comments: Speaking in full sentences.  Clear lung sounds in all fields.  No tenderness palpation of the chest wall. Abdominal:     General: Bowel sounds are normal. There is no distension.     Palpations: Abdomen is soft.     Tenderness: There is no abdominal tenderness.  Musculoskeletal: Normal range of motion.     Right lower leg: No edema.     Left lower leg: No edema.     Comments: No leg pain or swelling.  Skin:    General: Skin is warm and dry.     Capillary Refill: Capillary refill takes less than 2 seconds.  Neurological:     Mental Status: He is alert and oriented to person, place, and time.      ED Treatments / Results  Labs (all labs ordered are listed, but only abnormal results are displayed) Labs Reviewed  CBC  BASIC METABOLIC PANEL  TROPONIN I (HIGH SENSITIVITY)    EKG EKG Interpretation  Date/Time:  Monday August 20 2018 11:50:46 EDT Ventricular Rate:  53 PR Interval:  128 QRS Duration: 88 QT Interval:  478 QTC Calculation: 448 R Axis:   24 Text Interpretation:  Sinus bradycardia with sinus arrhythmia Otherwise normal ECG Elevated J point. Similar to April 2020 tracing.  Confirmed by Alona BeneLong, Joshua [phone] on 08/20/2018 12:00:34 PM   Radiology No results found.  Procedures Procedures (including critical care time)  Medications Ordered in ED Medications - No data to display   Initial Impression / Assessment and Plan / ED Course  I have reviewed the triage vital signs and the nursing notes.  Pertinent labs & imaging results that were available during my care of the patient were reviewed by me and considered in my medical decision making (see chart for details).     Patient presenting for evaluation of 10-minute episode of chest pain.  Physical exam reassuring, he appears nontoxic.  Patient with a history of prolonged QT and bradycardia.  EKG  today consistent with previous.  Pain is completely resolved.  Low suspicion for ACS, however as patient had severe chest pain with exertion, will obtain basic cardiac labs. Heart score 1. Case discussed with attending, Dr. Jacqulyn BathLong agrees to plan.  Labs reassuring, troponin negative.  Discussed follow-up with cardiology as needed for further evaluation.  Will give Voltaren gel for recurrence of pain, symptoms may be musculoskeletal.  At this time, patient appears safe for discharge.  Return precautions given.  Patient states he understands agrees plan.   Final Clinical Impressions(s) /  ED Diagnoses   Final diagnoses:  Atypical chest pain  Bradycardia    ED Discharge Orders         Ordered    diclofenac sodium (VOLTAREN) 1 % GEL  4 times daily PRN     08/20/18 1443           Aws Shere, PA-C 08/20/18 2052    Margette Fast, MD 08/21/18 [phone]

## 2018-08-20 NOTE — ED Notes (Signed)
Discharge instructions and prescriptions discussed with Pt. Pt verbalized understanding. Pt stable and ambulatory.   

## 2018-08-20 NOTE — Discharge Instructions (Signed)
Need to go to the ER for evaluation of chest pain

## 2018-08-20 NOTE — ED Notes (Signed)
Patient is being discharged from the [facility]gent [facility] and sent to the Emergency Department via wheelchair by staff. Per DR Meda Coffee patient is stable but in need of higher level of care due to cardiac workup. Patient is aware and verbalizes understanding of plan of care.   Vitals:   08/20/18 1043  BP: 119/68  Pulse: (!) 52  Resp: 18  Temp: 98 F (36.7 C)  SpO2: 100%

## 2018-08-20 NOTE — Discharge Instructions (Signed)
You may try the Voltaren gel if you have another episode of pain. Follow-up with cardiologist for further evaluation of your heart. Return to the emergency room if you develop persistent chest pain, difficulty breathing, or any new, worsening, or concerning symptoms.

## 2018-08-21 ENCOUNTER — Other Ambulatory Visit: Payer: Self-pay

## 2018-08-21 ENCOUNTER — Ambulatory Visit: Payer: Medicaid Other | Admitting: Family Medicine

## 2018-08-21 ENCOUNTER — Encounter: Payer: Self-pay | Admitting: Family Medicine

## 2018-08-21 VITALS — BP 112/68 | HR 85 | Temp 97.5°F | Resp 16 | Ht 74.0 in | Wt 236.1 lb

## 2018-08-21 DIAGNOSIS — R079 Chest pain, unspecified: Secondary | ICD-10-CM | POA: Diagnosis not present

## 2018-08-21 DIAGNOSIS — R9431 Abnormal electrocardiogram [ECG] [EKG]: Secondary | ICD-10-CM

## 2018-08-21 NOTE — Progress Notes (Signed)
Name: Mathew Doyle   MRN: [id number]    DOB: 02/06/1991   Date:08/21/2018       Progress Note  Subjective  Chief Complaint  Chief Complaint  Patient presents with  . Chest Pain    follow up ER need work clearance. Appointment with Cardiology July 22, Dr.Gollan    HPI   Pt presents for clearance for work.  He works [employer]. He was seen by ER on 08/20/2018 (yesterday), his pain has since been completely resolved.  Was told to see cardiology as needed for further evaluation - I advised he keep his appointment in July with Dr. Mariah MillingGollan for evaluation of his history of prolonged QT syndrome.   EKG yesterday was consistent with prior EKG's - QT prolongation. Troponin was negative, CBC and CMP were normal.  No chest Xray was performed. He states there was no tenderness to the chest, but he has been doing a fairly physical job, and it was suggested that this could be MSK related.  Denies any chest pain or shortness of breath overnight/since ER.   We will provide note for light duty until cleared by Dr. Mariah MillingGollan.   Patient Active Problem List   Diagnosis Date Noted  . 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 Class 1 obesity due to excess calories without serious comorbidity with body mass index (BMI) of 32.0 to 32.9 in adult 03/13/2018  . Lipid screening 03/13/2018    Social History   Tobacco Use  . Smoking status: Former Smoker    Types: Cigarettes  . Smokeless tobacco: Never Used  Substance Use Topics  . Alcohol use: Yes    Comment: occasional     Current Outpatient Medications:  Marland Kitchen.  Multiple Vitamin (MULTIVITAMIN WITH MINERALS) TABS tablet, Take 1 tablet by mouth daily., Disp: , Rfl:  .  diclofenac sodium (VOLTAREN) 1 % GEL, Apply 2 g topically 4 (four) times daily as needed., Disp: 100 g, Rfl: 0  No Known Allergies  I personally reviewed active problem list, medication list, allergies, notes from last encounter, lab results with the patient/caregiver today.  ROS  Ten systems  reviewed and is negative except as mentioned in HPI  Objective  Vitals:   08/21/18 1052  BP: 112/68  Pulse: 85  Resp: 16  Temp: (!) 97.5 F (36.4 C)  TempSrc: Oral  SpO2: 96%  Weight: 236 lb 1.6 oz (107.1 kg)  Height: 6\' 2"  (1.88 m)   Body mass index is 30.31 kg/m.  Nursing Note and Vital Signs reviewed.  Physical Exam  Constitutional: Patient appears well-developed and well-nourished. Obese. No distress.  HEENT: head atraumatic, normocephalic, pupils equal and reactive to light Cardiovascular: Normal rate, regular rhythm and normal heart sounds.  No murmur heard. No BLE edema. Pulmonary/Chest: Effort normal and breath sounds clear bilaterally. No respiratory distress. Abdominal: Soft, bowel sounds normal, there is no tenderness, no HSM Psychiatric: Patient has a normal mood and affect. behavior is normal. Judgment and thought content normal.  Results for orders placed or performed during the hospital encounter of 08/20/18 (from the past 72 hour(s))  CBC     Status: None   Collection Time: 08/20/18 12:13 PM  Result Value Ref Range   WBC 7.2 4.0 - 10.5 K/uL   RBC 5.09 4.22 - 5.81 MIL/uL   Hemoglobin 14.6 13.0 - 17.0 g/dL   HCT 11.942.8 14.739.0 - 82.952.0 %   MCV 84.1 80.0 - 100.0 fL   MCH 28.7 26.0 - 34.0 pg   MCHC 34.1 30.0 -  36.0 g/dL   RDW 12.3 11.5 - 15.5 %   Platelets 302 150 - 400 K/uL   nRBC 0.0 0.0 - 0.2 %    Comment: Performed at [facility], [place] [address], [place], [place] [address]  Basic metabolic panel     Status: None   Collection Time: 08/20/18 12:13 PM  Result Value Ref Range   Sodium 138 135 - 145 mmol/L   Potassium 4.3 3.5 - 5.1 mmol/L   Chloride 106 98 - 111 mmol/L   CO2 23 22 - 32 mmol/L   Glucose, Bld 89 70 - 99 mg/dL   BUN 16 6 - 20 mg/dL   Creatinine, Ser 1.05 0.61 - 1.24 mg/dL   Calcium 9.5 8.9 - 10.3 mg/dL   GFR calc non Af Amer >60 >60 mL/min   GFR calc Af Amer >60 >60 mL/min   Anion gap 9 5 - 15    Comment: Performed at [facility] [address] [place], [place] [address]  Troponin I (High Sensitivity)     Status: None   Collection Time: 08/20/18 12:13 PM  Result Value Ref Range   Troponin I (High Sensitivity) <2 <18 ng/L    Comment: Performed at [facility] [address], [place], [place] [address]    Assessment & Plan  1. Long QT interval 2. Chest pain, unspecified type - Advised return to work light duty until cleared by Cardiology.  Monitor symptoms very closely - stop work immediately if chest pain returns or any shortness of breath or palpitations or fatigue.  - Follow up with Cardiology in July - Red flags and when to present for emergency care or RTC including fever >101.28F, chest pain, shortness of breath, new/worsening/un-resolving symptoms, reviewed with patient at time of visit. Follow up and care instructions discussed and provided in AVS.

## 2018-09-11 ENCOUNTER — Telehealth: Payer: Self-pay | Admitting: Cardiovascular Disease

## 2018-09-11 DIAGNOSIS — R079 Chest pain, unspecified: Secondary | ICD-10-CM | POA: Insufficient documentation

## 2018-09-11 HISTORY — DX: Chest pain, unspecified: R07.9

## 2018-09-11 NOTE — Progress Notes (Signed)
Virtual Visit via Video Note   This visit type was conducted due to national recommendations for restrictions regarding the COVID-19 Pandemic (e.g. social distancing) in an effort to limit this patient's exposure and mitigate transmission in our community.  Due to his co-morbid illnesses, this patient is at least at moderate risk for complications without adequate follow up.  This format is felt to be most appropriate for this patient at this time.  All issues noted in this document were discussed and addressed.  A limited physical exam was performed with this format.  Please refer to the patient's chart for his consent to telehealth for [facility].   I connected with  Mathew Doyle on 09/12/18 by a video enabled telemedicine application and verified that I am speaking with the correct person using two identifiers. I discussed the limitations of evaluation and management by telemedicine. The patient expressed understanding and agreed to proceed.   Evaluation Performed:  Follow-up visit  Date:  09/12/2018   ID:  Mathew Doyle, DOB Aug 09, 1990, MRN [id number]  Patient Location:  [address] [place] [address]   Provider location:   [facility], [place] office  PCP:  Hubbard Hartshorn, FNP  Cardiologist:  Patsy Baltimore   Chief Complaint:  Chest pain    History of Present Illness:    Mathew Doyle is a 28 y.o. male who presents via audio/video conferencing for a telehealth visit today.   The patient does not symptoms concerning for COVID-19 infection (fever, chills, cough, or new SHORTNESS OF BREATH).   Patient has a past medical history of cardiac work-up since he was a child due to prolonged QT Chronic bradycardia Prior episodes of chest pain, ER visits April 2020, June 2020 Prior smoker Presents for new patient evaluation for chest pain  Previous episode chest pain April 2020 described as a knot in his left chest   Seen in the emergency  room August 20, 2018 for atypical chest pain Work-up negative, given NSAIDs for musculoskeletal chest pain  Seen by outside cardiology group Jun 25, 2018 Note indicates history of long QT on ECG, underwent prior cardiac work-up while living in [place] which included a stress test and Holter monitoring.  recurrent episodes of chest pain.  squeezing in sensation, which occur with and without exertion, comes and goes, occurs on most days.   He denies shortness of breath. He notes occasional heart racing.   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 Long Q-T syndrome    Past Surgical History:  Procedure Laterality Date  . CIRCUMCISION    . MANDIBLE FRACTURE SURGERY  2010     No outpatient medications have been marked as taking for the 09/12/18 encounter (Appointment) with Minna Merritts, MD.     Allergies:   Patient has no known allergies.   Social History   Tobacco Use  . Smoking status: Former Smoker    Types: Cigarettes  . Smokeless tobacco: Never Used  Substance Use Topics  . Alcohol use: Yes    Comment: occasional  . Drug use: Never     Current Outpatient Medications on File Prior to Visit  Medication Sig Dispense Refill  . diclofenac sodium (VOLTAREN) 1 % GEL Apply 2 g topically 4 (four) times daily as needed. 100 g 0  . Multiple Vitamin (MULTIVITAMIN WITH MINERALS) TABS tablet Take 1 tablet by mouth daily.     No current facility-administered medications on file prior to visit.      Family Hx: The patient's family history includes Heart disease in his father; Long QT syndrome in his father; Prostate cancer in his maternal grandfather.  ROS:   Please see the history of present illness.     Review of Systems  Constitutional: Negative.   Respiratory: Negative.   Cardiovascular: Negative.   Gastrointestinal: Negative.   Musculoskeletal: Negative.   Neurological: Negative.   Psychiatric/Behavioral: Negative.   All other systems reviewed and are negative.     Labs/Other Tests and Data Reviewed:    Recent Labs: 06/21/2018: ALT 24 08/20/2018: BUN 16; Creatinine, Ser 1.05; Hemoglobin 14.6; Platelets 302; Potassium 4.3; Sodium 138   Recent Lipid Panel Lab Results  Component Value Date/Time   CHOL 128 03/13/2018 10:19 AM   TRIG 51 03/13/2018 10:19 AM   HDL 41 03/13/2018 10:19 AM   CHOLHDL 3.1 03/13/2018 10:19 AM   LDLCALC 74 03/13/2018 10:19 AM    Wt Readings from Last 3 Encounters:  08/21/18 236 lb 1.6 oz (107.1 kg)  08/20/18 237 lb (107.5 kg)  06/21/18 250 lb (113.4 kg)     Exam:    Vital Signs: Vital signs may also be detailed in the HPI There were no vitals taken for this visit.  Wt Readings from Last 3 Encounters:  08/21/18 236 lb 1.6 oz (107.1 kg)  08/20/18 237 lb (107.5 kg)  06/21/18 250 lb (113.4 kg)   Temp Readings from Last 3 Encounters:  08/21/18 (!) 97.5 F (36.4 C) (Oral)  08/20/18 98.6 F (37 C) (Oral)  08/20/18 98 F (36.7 C) (Oral)   BP Readings from Last 3 Encounters:  08/21/18 112/68  08/20/18 123/76  08/20/18 119/68   Pulse Readings from Last 3 Encounters:  08/21/18 85  08/20/18 (!) 51  08/20/18 (!) 52     Well nourished, well developed male in no acute distress. Constitutional:  oriented to person, place, and time. No distress.  Head: Normocephalic and atraumatic.  Eyes:  no discharge. No scleral icterus.  Neck: Normal range of motion. Neck supple.  Pulmonary/Chest: No audible wheezing, no distress, appears comfortable Musculoskeletal: Normal range of motion.  no  tenderness or deformity.  Neurological:   Coordination normal. Full exam not performed Skin:  No rash Psychiatric:  normal mood and affect. behavior is normal.  Thought content normal.    ASSESSMENT & PLAN:    Problem List Items Addressed This Visit    Chest pain of uncertain etiology   Long QT interval - Primary     Atypical chest pain Works in [employer] representative[employer], most recent episode had left-sided chest pain after lifting heavy piece of wood Work-up in the emergency room on multiple occasions negative for cardiac etiology Reassurance provided concerning his prolonged QT based off recent EKGs Previously seen by outside cardiology and treadmill with monitor ordered These were not completed possibly secondary to virus We will ask around and see if they are doing treadmill studies and reorder.  He would like to complete a routine treadmill study to make sure his heart is okay -I am concerned about muscular ligamental strain on the left from repetitive injury at work, [employer] representative[employer] Discussed icing, NSAIDs, rest, massage  COVID-19 Education: The signs and symptoms of COVID-19 were discussed with the patient and how to seek care for testing (follow up with PCP or arrange E-visit).  The importance of social distancing was discussed today.  Patient Risk:   After full review of this patients clinical status, I feel that they are at least moderate risk at this time.  Time:   Today, I have spent 45 minutes with the patient with telehealth technology discussing the cardiac and medical problems/diagnoses detailed above   10 min spent reviewing the chart prior to patient visit today   Medication Adjustments/Labs and Tests Ordered: Current medicines are reviewed at length with the patient today.  Concerns regarding medicines are outlined above.   Tests Ordered: Routine treadmill study   Medication Changes: No changes made   Disposition: Follow-up as needed   Signed, Julien Nordmannimothy Gollan, MD  [facility] [place] Office [address], [place], [place] [id number]

## 2018-09-11 NOTE — Telephone Encounter (Signed)
Virtual Visit Pre-Appointment Phone Call  "(Name), I am calling you today to discuss your upcoming appointment. We are currently trying to limit exposure to the virus that causes COVID-19 by seeing patients at home rather than in the office."  1. "What is the BEST phone number to call the day of the visit?" - include this in appointment notes  2. Do you have or have access to (through a family member/friend) a smartphone with video capability that we can use for your visit?" a. If yes - list this number in appt notes as cell (if different from BEST phone #) and list the appointment type as a VIDEO visit in appointment notes b. If no - list the appointment type as a PHONE visit in appointment notes  3. Confirm consent - "In the setting of the current Covid19 crisis, you are scheduled for a (phone or video) visit with your provider on (date) at (time).  Just as we do with many in-office visits, in order for you to participate in this visit, we must obtain consent.  If you'd like, I can send this to your mychart (if signed up) or email for you to review.  Otherwise, I can obtain your verbal consent now.  All virtual visits are billed to your insurance company just like a normal visit would be.  By agreeing to a virtual visit, we'd like you to understand that the technology does not allow for your provider to perform an examination, and thus may limit your provider's ability to fully assess your condition. If your provider identifies any concerns that need to be evaluated in person, we will make arrangements to do so.  Finally, though the technology is pretty good, we cannot assure that it will always work on either your or our end, and in the setting of a video visit, we may have to convert it to a phone-only visit.  In either situation, we cannot ensure that we have a secure connection.  Are you willing to proceed?" STAFF: Did the patient verbally acknowledge consent to telehealth visit? Document  YES/NO here: YES  4. Advise patient to be prepared - "Two hours prior to your appointment, go ahead and check your blood pressure, pulse, oxygen saturation, and your weight (if you have the equipment to check those) and write them all down. When your visit starts, your provider will ask you for this information. If you have an Apple Watch or Kardia device, please plan to have heart rate information ready on the day of your appointment. Please have a pen and paper handy nearby the day of the visit as well."  5. Give patient instructions for MyChart download to smartphone OR Doximity/Doxy.me as below if video visit (depending on what platform provider is using)  6. Inform patient they will receive a phone call 15 minutes prior to their appointment time (may be from unknown caller ID) so they should be prepared to answer    TELEPHONE CALL NOTE  Mathew Doyle has been deemed a candidate for a follow-up tele-health visit to limit community exposure during the Covid-19 pandemic. I spoke with the patient via phone to ensure availability of phone/video source, confirm preferred email & phone number, and discuss instructions and expectations.  I reminded Mathew Doyle to be prepared with any vital sign and/or heart rhythm information that could potentially be obtained via home monitoring, at the time of his visit. I reminded Mathew Doyle to expect a phone call prior to his visit.  Mathew Doyle 09/11/2018 11:42 AM   INSTRUCTIONS FOR DOWNLOADING THE MYCHART APP TO SMARTPHONE  - The patient must first make sure to have activated MyChart and know their login information - If Apple, go to [employer] Store and type in MyChart in the search bar and download the app. If Android, ask patient to go to [employer] Play Store and type in [facility] in the search bar and download the app. The app is free but as with any other app downloads, their phone may require them to verify saved payment information or Apple/Android  password.  - The patient will need to then log into the app with their MyChart username and password, and select [facility] as their healthcare provider to link the account. When it is time for your visit, go to the MyChart app, find appointments, and click Begin Video Visit. Be sure to Select Allow for your device to access the Microphone and Camera for your visit. You will then be connected, and your provider will be with you shortly.  **If they have any issues connecting, or need assistance please contact MyChart service desk [phone])**  **If using a computer, in order to ensure the best quality for their visit they will need to use either of the following Internet Browsers: [employer] Edge, or Google Chrome**  IF USING DOXIMITY or DOXY.ME - The patient will receive a link just prior to their visit by text.     FULL LENGTH CONSENT FOR TELE-HEALTH VISIT   I hereby voluntarily request, consent and authorize [facility] and its employed or contracted physicians, physician assistants, nurse practitioners or other licensed health care professionals (the Practitioner), to provide me with telemedicine health care services (the Services") as deemed necessary by the treating Practitioner. I acknowledge and consent to receive the Services by the Practitioner via telemedicine. I understand that the telemedicine visit will involve communicating with the Practitioner through live audiovisual communication technology and the disclosure of certain medical information by electronic transmission. I acknowledge that I have been given the opportunity to request an in-person assessment or other available alternative prior to the telemedicine visit and am voluntarily participating in the telemedicine visit.  I understand that I have the right to withhold or withdraw my consent to the use of telemedicine in the course of my care at any time, without affecting my right to future care or treatment,  and that the Practitioner or I may terminate the telemedicine visit at any time. I understand that I have the right to inspect all information obtained and/or recorded in the course of the telemedicine visit and may receive copies of available information for a reasonable fee.  I understand that some of the potential risks of receiving the Services via telemedicine include:   Delay or interruption in medical evaluation due to technological equipment failure or disruption;  Information transmitted may not be sufficient (e.g. poor resolution of images) to allow for appropriate medical decision making by the Practitioner; and/or   In rare instances, security protocols could fail, causing a breach of personal health information.  Furthermore, I acknowledge that it is my responsibility to provide information about my medical history, conditions and care that is complete and accurate to the best of my ability. I acknowledge that Practitioner's advice, recommendations, and/or decision may be based on factors not within their control, such as incomplete or inaccurate data provided by me or distortions of diagnostic images or specimens that may result from electronic transmissions. I understand that the  practice of medicine is not an Chief Strategy Officer and that Practitioner makes no warranties or guarantees regarding treatment outcomes. I acknowledge that I will receive a copy of this consent concurrently upon execution via email to the email address I last provided but may also request a printed copy by calling the office of [facility].    I understand that my insurance will be billed for this visit.   I have read or had this consent read to me.  I understand the contents of this consent, which adequately explains the benefits and risks of the Services being provided via telemedicine.   I have been provided ample opportunity to ask questions regarding this consent and the Services and have had my questions  answered to my satisfaction.  I give my informed consent for the services to be provided through the use of telemedicine in my medical care  By participating in this telemedicine visit I agree to the above.

## 2018-09-12 ENCOUNTER — Telehealth (INDEPENDENT_AMBULATORY_CARE_PROVIDER_SITE_OTHER): Payer: Medicaid Other | Admitting: Cardiovascular Disease

## 2018-09-12 ENCOUNTER — Other Ambulatory Visit: Payer: Self-pay

## 2018-09-12 DIAGNOSIS — R002 Palpitations: Secondary | ICD-10-CM | POA: Diagnosis not present

## 2018-09-12 DIAGNOSIS — R079 Chest pain, unspecified: Secondary | ICD-10-CM

## 2018-09-12 DIAGNOSIS — R9431 Abnormal electrocardiogram [ECG] [EKG]: Secondary | ICD-10-CM | POA: Diagnosis not present

## 2018-09-12 DIAGNOSIS — R0789 Other chest pain: Secondary | ICD-10-CM | POA: Diagnosis not present

## 2018-09-12 NOTE — Patient Instructions (Addendum)
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  Provider notified and will call you if he has any further recommendations.   Medication Instructions:  No changes  If you need a refill on your cardiac medications before your next appointment, please call your pharmacy.    Lab work: No new labs needed   If you have labs (blood work) drawn today and your tests are completely normal, you will receive your results only by: Marland Kitchen MyChart Message (if you have MyChart) OR . A paper copy in the mail If you have any lab test that is abnormal or we need to change your treatment, we will call you to review the results.   Testing/Procedures: No new testing needed   Follow-Up: At [facility], you and your health needs are our priority.  As part of our continuing mission to provide you with exceptional heart care, we have created designated Provider Care Teams.  These Care Teams include your primary Cardiologist (physician) and Advanced Practice Providers (APPs -  Physician Assistants and Nurse Practitioners) who all work together to provide you with the care you need, when you need it.  . You will need a follow up appointment as needed   . Providers on your designated Care Team:   . Murray Hodgkins, NP . Christell Faith, PA-C . Marrianne Mood, PA-C  Any Other Special Instructions Will Be Listed Below (If Applicable).  For educational health videos Log in to : [email or web address] Or : [email or web address], password : [email or web address]

## 2018-09-13 NOTE — Progress Notes (Signed)
Spoke with patient and made him aware that due to COVID restrictions we are not doing treadmill testing at this time. Reviewed providers recommendations for NSAID'S and monitoring of symptoms. Reviewed that if pain persists to please call back and check in from time to time to see if we have resumed testing. He verbalized understanding of instructions, agreement with plan, and had no further questions at this time.

## 2018-09-13 NOTE — Progress Notes (Signed)
We will need to wait until treadmill testing is available, late secondary to COVID There is strong suspicion of muscular ligamental injury causing his left-sided chest pain Would certainly use icing and NSAIDs for recurrent pain, When he has episodes, try to distinguish if he is having tenderness on palpation, movement, deep inspiration indicating muscular ligamental pain

## 2018-12-05 NOTE — Progress Notes (Signed)
Needs order for ETT if still needed.    Thanks   [employer]

## 2018-12-05 NOTE — Progress Notes (Signed)
Scheduled ETT .  Unable to get same day covid testing so patient can go the day before as long as its before 330 pm and then do his test the following day.     Mailed reminders .

## 2018-12-05 NOTE — Addendum Note (Signed)
Addended by: Lamar Laundry on: 12/05/2018 04:05 PM   Modules accepted: Orders

## 2018-12-10 ENCOUNTER — Inpatient Hospital Stay: Admission: RE | Admit: 2018-12-10 | Payer: Medicaid Other | Source: Ambulatory Visit

## 2019-08-03 DIAGNOSIS — H5213 Myopia, bilateral: Secondary | ICD-10-CM | POA: Diagnosis not present

## 2019-08-22 DIAGNOSIS — Z419 Encounter for procedure for purposes other than remedying health state, unspecified: Secondary | ICD-10-CM | POA: Diagnosis not present

## 2019-09-22 DIAGNOSIS — Z419 Encounter for procedure for purposes other than remedying health state, unspecified: Secondary | ICD-10-CM | POA: Diagnosis not present

## 2019-10-23 DIAGNOSIS — Z419 Encounter for procedure for purposes other than remedying health state, unspecified: Secondary | ICD-10-CM | POA: Diagnosis not present

## 2019-11-22 DIAGNOSIS — Z419 Encounter for procedure for purposes other than remedying health state, unspecified: Secondary | ICD-10-CM | POA: Diagnosis not present

## 2019-12-17 DIAGNOSIS — H5213 Myopia, bilateral: Secondary | ICD-10-CM | POA: Diagnosis not present

## 2019-12-23 DIAGNOSIS — Z419 Encounter for procedure for purposes other than remedying health state, unspecified: Secondary | ICD-10-CM | POA: Diagnosis not present

## 2020-01-07 ENCOUNTER — Ambulatory Visit (INDEPENDENT_AMBULATORY_CARE_PROVIDER_SITE_OTHER): Payer: Medicaid Other

## 2020-01-07 ENCOUNTER — Ambulatory Visit (INDEPENDENT_AMBULATORY_CARE_PROVIDER_SITE_OTHER): Payer: Medicaid Other | Admitting: Podiatry

## 2020-01-07 ENCOUNTER — Encounter: Payer: Self-pay | Admitting: Podiatry

## 2020-01-07 ENCOUNTER — Other Ambulatory Visit: Payer: Self-pay

## 2020-01-07 DIAGNOSIS — M21611 Bunion of right foot: Secondary | ICD-10-CM

## 2020-01-07 DIAGNOSIS — M2041 Other hammer toe(s) (acquired), right foot: Secondary | ICD-10-CM | POA: Diagnosis not present

## 2020-01-07 DIAGNOSIS — M2011 Hallux valgus (acquired), right foot: Secondary | ICD-10-CM | POA: Diagnosis not present

## 2020-01-07 DIAGNOSIS — M2042 Other hammer toe(s) (acquired), left foot: Secondary | ICD-10-CM

## 2020-01-07 DIAGNOSIS — Z01818 Encounter for other preprocedural examination: Secondary | ICD-10-CM

## 2020-01-07 MED ORDER — ONDANSETRON HCL 4 MG PO TABS: 4.0000 mg | ORAL_TABLET | Freq: Three times a day (TID) | ORAL | 0 refills | Status: DC | PRN

## 2020-01-07 NOTE — Patient Instructions (Signed)
Pre-Operative Instructions  Congratulations, you have decided to take an important step towards improving your quality of life.  You can be assured that the doctors and staff at [facility] will be with you every step of the way.  Here are some important things you should know:  1. Plan to be at the surgery center/hospital at least 1 (one) hour prior to your scheduled time, unless otherwise directed by the surgical center/hospital staff.  You must have a responsible adult accompany you, remain during the surgery and drive you home.  Make sure you have directions to the surgical center/hospital to ensure you arrive on time. 2. If you are having surgery at [facility] or [facility] hospitals, you will need a copy of your medical history and physical form from your family physician within one month prior to the date of surgery. We will give you a form for your primary physician to complete.  3. We make every effort to accommodate the date you request for surgery.  However, there are times where surgery dates or times have to be moved.  We will contact you as soon as possible if a change in schedule is required.   4. No aspirin/ibuprofen for one week before surgery.  If you are on aspirin, any non-steroidal anti-inflammatory medications (Mobic, Aleve, Ibuprofen) should not be taken seven (7) days prior to your surgery.  You make take Tylenol for pain prior to surgery.  5. Medications - If you are taking daily heart and blood pressure medications, seizure, reflux, allergy, asthma, anxiety, pain or diabetes medications, make sure you notify the surgery center/hospital before the day of surgery so they can tell you which medications you should take or avoid the day of surgery. 6. No food or drink after midnight the night before surgery unless directed otherwise by surgical center/hospital staff. 7. No alcoholic beverages 24-hours prior to surgery.  No smoking 24-hours prior or 24-hours after  surgery. 8. Wear loose pants or shorts. They should be loose enough to fit over bandages, boots, and casts. 9. Don't wear slip-on shoes. Sneakers are preferred. 10. Bring your boot with you to the surgery center/hospital.  Also bring crutches or a walker if your physician has prescribed it for you.  If you do not have this equipment, it will be provided for you after surgery. 11. If you have not been contacted by the surgery center/hospital by the day before your surgery, call to confirm the date and time of your surgery. 12. Leave-time from work may vary depending on the type of surgery you have.  Appropriate arrangements should be made prior to surgery with your employer. 13. Prescriptions will be provided immediately following surgery by your doctor.  Fill these as soon as possible after surgery and take the medication as directed. Pain medications will not be refilled on weekends and must be approved by the doctor. 14. Remove nail polish on the operative foot and avoid getting pedicures prior to surgery. 15. Wash the night before surgery.  The night before surgery wash the foot and leg well with water and the antibacterial soap provided. Be sure to pay special attention to beneath the toenails and in between the toes.  Wash for at least three (3) minutes. Rinse thoroughly with water and dry well with a towel.  Perform this wash unless told not to do so by your physician.  Enclosed: 1 Ice pack (please put in freezer the night before surgery)   1 Hibiclens skin cleaner     Pre-op instructions  If you have any questions regarding the instructions, please do not hesitate to call our office.  [place]: [address], [place], [place] [address] -- [phone]  [place]: [address] [place], [place] [address] -- [phone]  [place]: [address], [place], [place] [address] -- [phone]   Website: [email or web address] 

## 2020-01-08 ENCOUNTER — Encounter: Payer: Self-pay | Admitting: Podiatry

## 2020-01-08 NOTE — Progress Notes (Signed)
Subjective:  Patient ID: Mathew Doyle, male    DOB: 08/12/1990,  MRN: [id number]  Chief Complaint  Patient presents with  . Hammer Toe    Patient presents today for painful hammertoes bilat 2-4    29 y.o. male presents with the above complaint.  Patient presents with complaint of bilateral hammertoes as well as bunion deformity to both sides.  The right side is hurting slightly more than left side.  But patient states that both sides are very painful.  He states he has tried shoe gear modification various different conservative treatment options none of that has helped.  He would like to discuss treatment options for this.  Patient states been going for 2 to 4 years may have been due to wearing smaller than his normal foot size shoes.  Is achy feeling.  Corns on top that tends to be very painful.  Patient tried soaking Epson salts nothing has helped.  He would like to discuss surgical options at this time.  He denies any other acute complaints.  Review of Systems: Negative except as noted in the HPI. Denies N/V/F/Ch.  Past Medical History:  Diagnosis Date  . Long Q-T syndrome     Current Outpatient Medications:  .  diclofenac sodium (VOLTAREN) 1 % GEL, Apply 2 g topically 4 (four) times daily as needed., Disp: 100 g, Rfl: 0 .  Multiple Vitamin (MULTIVITAMIN WITH MINERALS) TABS tablet, Take 1 tablet by mouth daily., Disp: , Rfl:  .  ondansetron (ZOFRAN) 4 MG tablet, Take 1 tablet (4 mg total) by mouth every 8 (eight) hours as needed for nausea or vomiting., Disp: 20 tablet, Rfl: 0  Social History   Tobacco Use  Smoking Status Former Smoker  . Types: Cigarettes  Smokeless Tobacco Never Used    No Known Allergies Objective:  There were no vitals filed for this visit. There is no height or weight on file to calculate BMI. Constitutional Well developed. Well nourished.  Vascular Dorsalis pedis pulses palpable bilaterally. Posterior tibial pulses palpable bilaterally. Capillary  refill normal to all digits.  No cyanosis or clubbing noted. Pedal hair growth normal.  Neurologic Normal speech. Oriented to person, place, and time. Epicritic sensation to light touch grossly present bilaterally.  Dermatologic Nails well groomed and normal in appearance. No open wounds. No skin lesions.  Orthopedic: Normal joint ROM without pain or crepitus bilaterally. Hallux abductovalgus deformity present mild bilaterally.  Hallux valgus noted.  No first MPJ intra-articular pain noted. Left 1st MPJ diminished range of motion. Left 1st TMT without gross hypermobility. Right 1st MPJ diminished range of motion  Right 1st TMT without gross hypermobility. Lesser digital contractures present bilaterally.  Semiflexible DIPJ and PIPJ hammertoe contractures noted bilaterally with second third and fourth digit.  There is adductovarus rotation of the fifth digit noted with hammertoe contracture.  All the hammertoes are semiflexible in nature.   Radiographs: Taken and reviewed. Hallux abductovalgus deformity present. Metatarsal parabola normal. 1st/2nd IMA: Mild.  There is an increasing hallux valgus angle.  Sesamoid positions are within normal limits.  There is an increasing hallux interphalangeus angle.  Assessment:   1. Hammertoe of right foot   2. Acquired hammertoe of left foot   3. Preoperative examination   4. Bunion of right foot   5. Hallux interphalangeus, acquired, right    Plan:  Patient was evaluated and treated and all questions answered.  Hallux abductovalgus deformity with primary soft tissue contracture with underlying hallux interphalangeus and hammertoe contractures 2 through  4 DIPJ bilaterally and hammertoe contracture of the fifth digit at the PIPJ -I explained the patient the etiology of hallux valgus and bunion deformity and hammertoe contracture as well as treatment options were discussed.  Patient's primary driving force behind this is a pes planovalgus foot  structure however patient does have soft tissue structures that are tight at the first MPJ leading to hallux valgus deformity and very mild nature.  Patient also has hallux interphalangeus I believe he will benefit from an Akin osteotomy/phalangeal osteotomy.  I also believe he will benefit from a soft tissue release such as McBride.  I discussed with the patient in extensive detail patient agrees with the plan.  I have consented him for a bunion procedure as well however given the sesamoid position patient may not need a metatarsal osteotomy at this time.  Patient will also benefit from hammertoe arthroplasty of the right second third and fourth digit with DIPJ arthroplasty.  I discussed my surgical plan in extensive detail.  Patient agrees with the plan and he would like to proceed with the surgery.  I also discussed with him that he will benefit from hammertoe correction of fifth digit as well with derotational arthroplasty.  Patient agrees with the plan -He will be weightbearing as tolerated in cam boot -Informed surgical risk consent was reviewed and read aloud to the patient.  I reviewed the films.  I have discussed my findings with the patient in great detail.  I have discussed all risks including but not limited to infection, stiffness, scarring, limp, disability, deformity, damage to blood vessels and nerves, numbness, poor healing, need for braces, arthritis, chronic pain, amputation, death.  All benefits and realistic expectations discussed in great detail.  I have made no promises as to the outcome.  I have provided realistic expectations.  I have offered the patient a 2nd opinion, which they have declined and assured me they preferred to proceed despite the risks -A total of 45 minutes was spent in direct patient care as well as pre and post patient encounter activities.  This includes documentation as well as reviewing patient chart for labs, imaging, past medical, surgical, social, and family  history as documented in the EMR.  I have reviewed medication allergies as documented in EMR.  I discussed the etiology of condition and treatment options from conservative to surgical care.  All risks and benefit of the treatment course was discussed in detail.  All questions were answered and return appointment was discussed.  Since the visit completed in an ambulatory/outpatient setting, the patient and/or parent/guardian has been advised to contact the providers office for worsening condition and seek medical treatment and/or call [phone] if the patient deems either is necessary.   No follow-ups on file.

## 2020-01-09 DIAGNOSIS — H5213 Myopia, bilateral: Secondary | ICD-10-CM | POA: Diagnosis not present

## 2020-01-14 ENCOUNTER — Encounter: Payer: Self-pay | Admitting: Family Medicine

## 2020-01-14 ENCOUNTER — Ambulatory Visit: Payer: Medicaid Other | Admitting: Family Medicine

## 2020-01-14 ENCOUNTER — Other Ambulatory Visit: Payer: Self-pay

## 2020-01-14 VITALS — BP 120/80 | HR 87 | Temp 98.5°F | Resp 18 | Ht 74.0 in | Wt 266.9 lb

## 2020-01-14 DIAGNOSIS — Z87898 Personal history of other specified conditions: Secondary | ICD-10-CM | POA: Diagnosis not present

## 2020-01-14 DIAGNOSIS — Z01818 Encounter for other preprocedural examination: Secondary | ICD-10-CM

## 2020-01-14 DIAGNOSIS — Z6834 Body mass index (BMI) 34.0-34.9, adult: Secondary | ICD-10-CM | POA: Diagnosis not present

## 2020-01-14 DIAGNOSIS — E669 Obesity, unspecified: Secondary | ICD-10-CM

## 2020-01-14 NOTE — Progress Notes (Signed)
Name: Mathew Doyle   MRN: [id number]    DOB: [birth year]   Date:01/14/2020       Progress Note  Chief Complaint  Patient presents with  . surgical clerance    foot sugery [facility]     Subjective:   Mathew Doyle is a 29 y.o. male, presents to clinic for preop history and physical    Procedure with Dr. Allena Katz for right foot/toes: BUNIONECTOMY Right Regional  WITH IV SEDATION    AIKEN OSTEOTOMY Right Regional  SESMOIDECTOMY Right Regional  CAPSULOTOMY- TOES 2-5 Right Regional  HAMMER TOE CORRECTION 2-5       Here for H&P prior to procedure by podiatrist, scheduled in Dec  Hx of prolonged QTc, last EKG reviewed was sinus bradycardia with QTc WNL Father had hx of heart disease, possibly CABG, and prolonged QTc Died traumatically, not related to his med hx  Pt denies any CP, palpitations, shortness of breath, orthopnea, lower extremity edema, near syncope No exertional symptoms whatsoever  Not any meds No allergies All hx updated  Pt last OV was with [name] 07/2018 Saw Cardiology in the past but was lost to f/up, had pending stress test - Dr. Mariah Milling - last OV and EKG reviewed today     No current outpatient medications on file.  Patient Active Problem List   Diagnosis Date Noted  . Chest pain of uncertain etiology 09/11/2018  . 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 Class 1 obesity due to excess calories without serious comorbidity with body mass index (BMI) of 32.0 to 32.9 in adult 03/13/2018  . Lipid screening 03/13/2018    Past Surgical History:  Procedure Laterality Date  . CIRCUMCISION    . MANDIBLE FRACTURE SURGERY  2010    Family History  Problem Relation Age of Onset  . Long QT syndrome Father   . Heart disease Father   . Prostate cancer Maternal Grandfather     Social History   Tobacco Use  . Smoking status: Former Smoker    Types: Cigarettes  . Smokeless tobacco: Never Used  Vaping Use  . Vaping Use: Never used  Substance Use  Topics  . Alcohol use: Yes    Comment: occasional  . Drug use: Never     No Known Allergies  Health Maintenance  Topic Date Due  . Hepatitis C Screening  Never done  . COVID-19 Vaccine (1) Never done  . TETANUS/TDAP  Never done  . INFLUENZA VACCINE  Never done  . HIV Screening  Completed    Chart Review Today: I personally reviewed active problem list, medication list, allergies, family history, social history, health maintenance, notes from last encounter, lab results, imaging with the patient/caregiver today.   Review of Systems  Constitutional: Negative.   HENT: Negative.   Eyes: Negative.   Respiratory: Negative.  Negative for chest tightness, shortness of breath and wheezing.   Cardiovascular: Negative.  Negative for chest pain and leg swelling.  Gastrointestinal: Negative.   Endocrine: Negative.   Genitourinary: Negative.   Musculoskeletal: Negative.   Skin: Negative.   Allergic/Immunologic: Negative.   Neurological: Negative.  Negative for dizziness and syncope.  Hematological: Negative.   Psychiatric/Behavioral: Negative.   All other systems reviewed and are negative.    Objective:   Vitals:   01/14/20 1413  BP: 120/80  Pulse: 87  Resp: 18  Temp: 98.5 F (36.9 C)  TempSrc: Oral  SpO2: 99%  Weight: 266 lb 14.4 oz (121.1 kg)  Height: 6'  2" (1.88 m)    Body mass index is 34.27 kg/m.  Physical Exam Vitals and nursing note reviewed.  Constitutional:      General: He is not in acute distress.    Appearance: Normal appearance. He is well-developed and well-groomed. He is obese. He is not ill-appearing, toxic-appearing or diaphoretic.     Interventions: Face mask in place.  HENT:     Head: Normocephalic and atraumatic.     Jaw: There is normal jaw occlusion. No trismus.     Right Ear: Tympanic membrane, ear canal and external ear normal.     Left Ear: Tympanic membrane, ear canal and external ear normal.     Nose: Nose normal. No mucosal edema or  congestion.     Right Turbinates: Not enlarged.     Left Turbinates: Not enlarged.     Mouth/Throat:     Lips: Pink.     Mouth: Mucous membranes are moist.     Dentition: Normal dentition.     Pharynx: Oropharynx is clear. Uvula midline. No pharyngeal swelling, oropharyngeal exudate, posterior oropharyngeal erythema or uvula swelling.  Eyes:     General: Lids are normal. No scleral icterus.       Right eye: No discharge.        Left eye: No discharge.     Conjunctiva/sclera: Conjunctivae normal.  Neck:     Trachea: Trachea and phonation normal. No tracheal deviation.  Cardiovascular:     Rate and Rhythm: Normal rate and regular rhythm.  No extrasystoles are present.    Chest Wall: PMI is not displaced.     Pulses: Normal pulses.          Radial pulses are 2+ on the right side and 2+ on the left side.     Heart sounds: Normal heart sounds. No murmur heard.  No friction rub. No gallop.   Pulmonary:     Effort: Pulmonary effort is normal. No respiratory distress.     Breath sounds: Normal breath sounds. No stridor. No wheezing, rhonchi or rales.  Abdominal:     General: Bowel sounds are normal. There is no distension.     Palpations: Abdomen is soft.     Tenderness: There is no abdominal tenderness. There is no guarding.  Musculoskeletal:     Cervical back: Normal range of motion.     Right lower leg: No edema.     Left lower leg: No edema.  Skin:    General: Skin is warm and dry.     Coloration: Skin is not jaundiced.     Findings: No rash.     Nails: There is no clubbing.  Neurological:     Mental Status: He is alert. Mental status is at baseline.     Cranial Nerves: No dysarthria or facial asymmetry.     Motor: No tremor or abnormal muscle tone.     Coordination: Coordination normal.     Gait: Gait normal.  Psychiatric:        Mood and Affect: Mood normal.        Speech: Speech normal.        Behavior: Behavior normal. Behavior is cooperative.         Assessment  & Plan:     ICD-10-CM   1. Preop general physical exam  Z01.818    completed history and physical, paperwork H&P completed, faxed to podiatry, and to be scanned into chart  2. History of prolonged Q-T interval on ECG  T02.409   3. Class 1 obesity with body mass index (BMI) of 34.0 to 34.9 in adult, unspecified obesity type, unspecified whether serious comorbidity present  E66.9    Z68.34    Pt recommended to f/up for CPE - overdue  preop H&P: Pt is a healthy 29 year old male, obese, no past complications with anesthesia, no recent infections, patient denies any current cardiac or pulmonary symptoms.  He did have a history of atypical chest pain over a year ago which was thought to be chest wall pain or muscle skeletal, however he has had no recent recurrence of this pain, no exertional symptoms.  On exam today he has normal blood pressure, normal cardiac exam no signs or symptoms of CHF. Hx of prolonged QTc -was lost to follow-up with cardiology who had planned a follow-up stress test after ER visit for chest pain.  Patient does not smoke.  Procedure is not high risk -IV sedation with regional anesthesia,  I do not feel the patient will require a stress test based on acceptable risk for his age and current health, he has good exercise tolerance and good functional capacity. did encourage patient to follow-up with cardiology anyhow  No recent labs were done but his labs from last year's physical were reviewed he had normal lipids, renal function, blood sugar and hemoglobin.  No current indication for chest x-ray, and will defer any repeated ECG to cardiology  I feel he is acceptable health for low risk surgery.  H&P completed   Danelle Berry, PA-C 01/14/20 2:28 PM

## 2020-01-21 ENCOUNTER — Ambulatory Visit: Payer: Medicaid Other | Admitting: Family Medicine

## 2020-01-22 DIAGNOSIS — Z419 Encounter for procedure for purposes other than remedying health state, unspecified: Secondary | ICD-10-CM | POA: Diagnosis not present

## 2020-01-23 ENCOUNTER — Telehealth: Payer: Self-pay

## 2020-01-23 NOTE — Telephone Encounter (Signed)
DOS 02/03/2020  RECEIVED FAX AUTH FOR CPT 25672, [id number], [id number] & [id number]  AUTH # [id number]

## 2020-01-28 ENCOUNTER — Other Ambulatory Visit: Payer: Self-pay

## 2020-01-28 ENCOUNTER — Encounter (HOSPITAL_BASED_OUTPATIENT_CLINIC_OR_DEPARTMENT_OTHER): Payer: Self-pay | Admitting: Podiatry

## 2020-01-28 ENCOUNTER — Encounter (HOSPITAL_COMMUNITY): Payer: Self-pay | Admitting: Anesthesiology

## 2020-01-28 NOTE — Progress Notes (Signed)
Dr. Donavan Foil reviewed pt's cardiac history, and ordered pt to come in prior to surgery for an EKG.  Pt does not need to follow up with a cardiologist prior to surgery for 02/03/2020.

## 2020-01-30 ENCOUNTER — Other Ambulatory Visit (HOSPITAL_COMMUNITY): Payer: Self-pay

## 2020-01-31 NOTE — Progress Notes (Signed)
Reminder text sent to patient to go for covid testing today or tomorrow. Address for testing site and hours provided to patient. 

## 2020-02-03 ENCOUNTER — Ambulatory Visit (HOSPITAL_BASED_OUTPATIENT_CLINIC_OR_DEPARTMENT_OTHER): Admission: RE | Admit: 2020-02-03 | Payer: Medicaid Other | Source: Ambulatory Visit | Admitting: Podiatry

## 2020-02-03 HISTORY — DX: Other complications of anesthesia, initial encounter: T88.59XA

## 2020-02-03 SURGERY — BUNIONECTOMY
Anesthesia: Regional | Site: Toe | Laterality: Right

## 2020-02-11 ENCOUNTER — Encounter: Payer: Medicaid Other | Admitting: Podiatry

## 2020-02-13 ENCOUNTER — Encounter: Payer: Medicaid Other | Admitting: Podiatry

## 2020-02-22 DIAGNOSIS — Z419 Encounter for procedure for purposes other than remedying health state, unspecified: Secondary | ICD-10-CM | POA: Diagnosis not present

## 2020-02-25 ENCOUNTER — Encounter: Payer: Medicaid Other | Admitting: Podiatry

## 2020-03-24 DIAGNOSIS — Z419 Encounter for procedure for purposes other than remedying health state, unspecified: Secondary | ICD-10-CM | POA: Diagnosis not present

## 2020-04-21 DIAGNOSIS — Z419 Encounter for procedure for purposes other than remedying health state, unspecified: Secondary | ICD-10-CM | POA: Diagnosis not present

## 2020-05-22 DIAGNOSIS — Z419 Encounter for procedure for purposes other than remedying health state, unspecified: Secondary | ICD-10-CM | POA: Diagnosis not present

## 2020-06-21 DIAGNOSIS — Z419 Encounter for procedure for purposes other than remedying health state, unspecified: Secondary | ICD-10-CM | POA: Diagnosis not present

## 2020-07-22 DIAGNOSIS — Z419 Encounter for procedure for purposes other than remedying health state, unspecified: Secondary | ICD-10-CM | POA: Diagnosis not present

## 2020-08-21 DIAGNOSIS — Z419 Encounter for procedure for purposes other than remedying health state, unspecified: Secondary | ICD-10-CM | POA: Diagnosis not present

## 2020-09-09 ENCOUNTER — Ambulatory Visit: Payer: Medicaid Other | Admitting: Family Medicine

## 2020-09-21 DIAGNOSIS — Z419 Encounter for procedure for purposes other than remedying health state, unspecified: Secondary | ICD-10-CM | POA: Diagnosis not present

## 2020-10-22 DIAGNOSIS — Z419 Encounter for procedure for purposes other than remedying health state, unspecified: Secondary | ICD-10-CM | POA: Diagnosis not present

## 2020-11-21 DIAGNOSIS — Z419 Encounter for procedure for purposes other than remedying health state, unspecified: Secondary | ICD-10-CM | POA: Diagnosis not present

## 2020-12-16 ENCOUNTER — Other Ambulatory Visit: Payer: Self-pay

## 2020-12-16 ENCOUNTER — Encounter: Payer: Self-pay | Admitting: Family Medicine

## 2020-12-16 ENCOUNTER — Ambulatory Visit (INDEPENDENT_AMBULATORY_CARE_PROVIDER_SITE_OTHER): Payer: Medicaid Other | Admitting: Family Medicine

## 2020-12-16 ENCOUNTER — Ambulatory Visit: Payer: Self-pay | Admitting: *Deleted

## 2020-12-16 VITALS — BP 116/82 | HR 79 | Temp 98.3°F | Resp 18 | Ht 74.0 in | Wt 268.8 lb

## 2020-12-16 DIAGNOSIS — R079 Chest pain, unspecified: Secondary | ICD-10-CM | POA: Diagnosis not present

## 2020-12-16 NOTE — Progress Notes (Signed)
    SUBJECTIVE:   CHIEF COMPLAINT / HPI:   CHEST PAIN Duration: since this morning (was walking at the time) , lasted about 10 minutes, resolved with rest Onset: sudden Quality: "grabbing" Location: upper left Radiation:  upper back Episode duration: 10 mins Related to exertion: no Activity when pain started: walking Trauma: no Anxiety/recent stressors: yes Aggravating factors: moving L arm, rotating to L side Alleviating factors: rest Status:  resolved Treatments attempted: none Current pain status: pain free Shortness of breath: no Cough: no Nausea: no Diaphoresis: no Heartburn: no Palpitations: during episode Dad with open heart surgery, maybe 50-60s, unsure of age.  Works out sporadically, Insurance underwriter, lifting, pushups.    OBJECTIVE:   BP 116/82   Pulse 79   Temp 98.3 F (36.8 C) (Oral)   Resp 18   Ht 6\' 2"  (1.88 m)   Wt 268 lb 12.8 oz (121.9 kg)   SpO2 99%   BMI 34.51 kg/m   Gen: well appearing, in NAD Card: RRR, no murmur Lungs: CTAB, no pleuritic pain, no wheeze/rales/rhonci MSK: no asymmetry. Full AROM of UE without pain. No chest wall tenderness, non TTP to paravertebral back musculature. Reproduction of symptoms with twisting torso to L side. Ext: WWP, no edema   ASSESSMENT/PLAN:   Chest pain Likely MSK etiology, possible muscle spasm given character and reproduction of symptoms on exam and report of sporadic weight lifting. May also have stress component as dealing with wife undergoing health challenges. Normal cardiac/lung exam today. EKG NSR.  Reassurance provided. Heat and NSAIDs for pain relief if recurs. F/u if symptoms change, become more frequent or painful or refractory to conservative management.     , DO

## 2020-12-16 NOTE — Telephone Encounter (Signed)
CP like a muscle pull 1st occurred this am with left arm movement and turning to the side. Feels the pain in the back when it occurs. Denies SOB/dizziness/sweating/N/V. Denies pain with deep breathing and resting. Denies palpitations/irregular hr.No accident/injury defined. Take ibuprofen this morning. Afternoon appointment with Revonda Standard. Patient agrees to plan.       Reason for Disposition  [1] Chest pain lasts < 5 minutes AND [2] NO chest pain or cardiac symptoms (e.g., breathing difficulty, sweating) now (Exception: chest pains that last only a few seconds)  Answer Assessment - Initial Assessment Questions 1. LOCATION: "Where does it hurt?"       Left sided CP and  2. RADIATION: "Does the pain go anywhere else?" (e.g., into neck, jaw, arms, back)    constant 3. ONSET: "When did the chest pain begin?" (Minutes, hours or days)      This morning 4. PATTERN "Does the pain come and go, or has it been constant since it started?"  "Does it get worse with exertion?"      constant 5. DURATION: "How long does it last" (e.g., seconds, minutes, hours)     constant 6. SEVERITY: "How bad is the pain?"  (e.g., Scale 1-10; mild, moderate, or severe)    - MILD (1-3): doesn't interfere with normal activities     - MODERATE (4-7): interferes with normal activities or awakens from sleep    - SEVERE (8-10): excruciating pain, unable to do any normal activities       7 7. CARDIAC RISK FACTORS: "Do you have any history of heart problems or risk factors for heart disease?" (e.g., angina, prior heart attack; diabetes, high blood pressure, high cholesterol, smoker, or strong family history of heart disease)     Long QT syndrome 8. PULMONARY RISK FACTORS: "Do you have any history of lung disease?"  (e.g., blood clots in lung, asthma, emphysema, birth control pills)     no 9. CAUSE: "What do you think is causing the chest pain?"    unknown 10. OTHER SYMPTOMS: "Do you have any other symptoms?" (e.g., dizziness,  nausea, vomiting, sweating, fever, difficulty breathing, cough)       Dizziness in the beginning none since 11. PREGNANCY: "Is there any chance you are pregnant?" "When was your last menstrual period?"       na  Protocols used: Chest Pain-A-AH

## 2020-12-16 NOTE — Assessment & Plan Note (Addendum)
Likely MSK etiology, possible muscle spasm given character and reproduction of symptoms on exam and report of sporadic weight lifting. May also have stress component as dealing with wife undergoing health challenges. Normal cardiac/lung exam today. EKG NSR.  Reassurance provided. Heat and NSAIDs for pain relief if recurs. F/u if symptoms change, become more frequent or painful or refractory to conservative management.

## 2020-12-22 DIAGNOSIS — Z419 Encounter for procedure for purposes other than remedying health state, unspecified: Secondary | ICD-10-CM | POA: Diagnosis not present

## 2021-01-21 DIAGNOSIS — Z419 Encounter for procedure for purposes other than remedying health state, unspecified: Secondary | ICD-10-CM | POA: Diagnosis not present

## 2021-02-21 DIAGNOSIS — Z419 Encounter for procedure for purposes other than remedying health state, unspecified: Secondary | ICD-10-CM | POA: Diagnosis not present

## 2021-03-08 ENCOUNTER — Other Ambulatory Visit: Payer: Self-pay

## 2021-03-08 ENCOUNTER — Encounter: Payer: Self-pay | Admitting: Emergency Medicine

## 2021-03-08 ENCOUNTER — Ambulatory Visit
Admission: EM | Admit: 2021-03-08 | Discharge: 2021-03-08 | Disposition: A | Discharge status: Home / Self Care | Payer: Medicaid Other | Attending: Emergency Medicine | Admitting: Emergency Medicine

## 2021-03-08 DIAGNOSIS — S61211A Laceration without foreign body of left index finger without damage to nail, initial encounter: Secondary | ICD-10-CM

## 2021-03-08 MED ORDER — OXYCODONE HCL 5 MG PO TABS: 5.0000 mg | ORAL_TABLET | ORAL | 0 refills | Status: DC | PRN

## 2021-03-08 NOTE — ED Provider Notes (Signed)
[facility]    CSN: [id number] Arrival date & time: 03/08/21  1237      History   Chief Complaint Chief Complaint  Patient presents with   Laceration    HPI Mathew Doyle is a 31 y.o. male.   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  Bleeding still present.  Denies numbness, tingling, prior injury or trauma.  Range of motion is intact.  Has received tetanus booster in the last 2 to 3 years.  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 Authorizing Provider  oxyCODONE (OXY IR/ROXICODONE) 5 MG immediate release tablet Take 1 tablet (5 mg total) by mouth every 4 (four) hours as needed for severe pain. 03/08/21  Yes Rayquon Uselman, Elita Boone, NP    Family History Family History  Problem Relation Age of Onset   Long QT syndrome Father    Heart disease Father    Prostate cancer Maternal Grandfather     Social History Social History   Tobacco Use   Smoking status: Former    Types: Cigarettes   Smokeless tobacco: Never  Vaping Use   Vaping Use: Never used  Substance Use Topics   Alcohol use: Yes    Comment: occasional   Drug use: Never     Allergies   Patient has no known allergies.   Review of Systems Review of Systems  Constitutional: Negative.   Respiratory: Negative.     Cardiovascular: Negative.   Musculoskeletal: Negative.   Skin:  Positive for wound. Negative for color change, pallor and rash.  Neurological: Negative.     Physical Exam Triage Vital Signs ED Triage Vitals  Enc Vitals Group     BP 03/08/21 1302 116/73     Pulse Rate 03/08/21 1302 (!) 57     Resp 03/08/21 1302 18     Temp 03/08/21 1302 98.2 F (36.8 C)     Temp Source 03/08/21 1302 Oral     SpO2 03/08/21 1302 100 %     Weight 03/08/21 1301 268 lb 11.9 oz (121.9 kg)     Height 03/08/21 1301 6\' 2"  (1.88 m)     Head Circumference --      Peak Flow --      Pain Score 03/08/21 1300 1     Pain Loc --      Pain Edu? --      Excl. in GC? --    No data found.  Updated Vital Signs BP 116/73 (BP Location: Right Arm)    Pulse (!) 57    Temp 98.2 F (36.8 C) (Oral)    Resp 18    Ht 6\' 2"  (1.88 m)    Wt  268 lb 11.9 oz (121.9 kg)    SpO2 100%    BMI 34.50 kg/m   Visual Acuity Right Eye Distance:   Left Eye Distance:   Bilateral Distance:    Right Eye Near:   Left Eye Near:    Bilateral Near:     Physical Exam Constitutional:      Appearance: Normal appearance.  HENT:     Head: Normocephalic.  Eyes:     Extraocular Movements: Extraocular movements intact.  Pulmonary:     Effort: Pulmonary effort is normal.  Skin:    Comments: 3x1 cm laceration to base of 2nd metatarsal of left hand, no involvement of the tendon   Neurological:     Mental Status: He is alert and oriented to person, place, and time. Mental status is at baseline.  Psychiatric:        Mood and Affect: Mood normal.        Behavior: Behavior normal.     UC Treatments / Results  Labs (all labs ordered are listed, but only abnormal results are displayed) Labs Reviewed - No data to display  EKG   Radiology No results found.  Procedures Laceration Repair  Date/Time: 03/08/2021 2:01 PM Performed by: Valinda HoarWhite, Jailen Lung R, NP Authorized by: Valinda HoarWhite, Lamar Naef R, NP   Consent:    Consent obtained:   Verbal   Consent given by:  Patient   Risks, benefits, and alternatives were discussed: yes     Risks discussed:  Infection and pain Universal protocol:    Procedure explained and questions answered to patient or proxy's satisfaction: yes     Patient identity confirmed:  Verbally with patient Anesthesia:    Anesthesia method:  Local infiltration   Local anesthetic:  Lidocaine 1% w/o epi Laceration details:    Location:  Finger   Finger location:  L index finger   Length (cm):  3   Depth (mm):  1 Pre-procedure details:    Preparation:  Patient was prepped and draped in usual sterile fashion Exploration:    Limited defect created (wound extended): no     Wound exploration: entire depth of wound visualized     Contaminated: no   Treatment:    Area cleansed with:  Soap and water   Amount of cleaning:  Standard   Irrigation solution:  Sterile saline   Irrigation volume:  10   Irrigation method:  Syringe   Debridement:  None Skin repair:    Repair method:  Sutures   Suture size:  4-0   Suture material:  Prolene   Suture technique:  Simple interrupted   Number of sutures:  4 Approximation:    Approximation:  Close Repair type:    Repair type:  Simple Post-procedure details:    Dressing:  Open (no dressing)   Procedure completion:  Tolerated (including critical care time)  Medications Ordered in UC Medications - No data to display  Initial Impression / Assessment and Plan / UC Course  I have reviewed the triage vital signs and the nursing notes.  Pertinent labs & imaging results that were available during my care of the patient were reviewed by me and considered in my medical decision making (see chart for details).  Laceration of left index finger without foreign body without damage to nail, initial encounter  4 sutures placed, patient to return in 7 to 10 days for removal, given strict return precautions for signs of infection, may cleanse with normal hygiene with  diluted soapy water, pat  dry and leave open to air, advised patient to cover while at work as to prevent infection, may complete activity as tolerated ,oxycodone 5 mg immediate release every 4 hours as needed, PDMP reviewed, low risk Final Clinical Impressions(s) / UC Diagnoses   Final diagnoses:  Laceration of right hand without foreign body, initial encounter     Discharge Instructions      He is return in 7 to 10 days for removal of hand stitches, 4 sutures placed today  May complete daily hygiene as normal, cleansing area with diluted soapy water, pat dry, may leave open to air  May complete activity as tolerated, please cover hand if there will be no known exposure to dirt or particles while at work  Please watch for signs of infection such as increased pain, increased swelling, drainage, fever or chills  If you begin to have numbness, tingling, limited movement please follow-up with orthopedic specialist    ED Prescriptions     Medication Sig Dispense Auth. Provider   oxyCODONE (OXY IR/ROXICODONE) 5 MG immediate release tablet Take 1 tablet (5 mg total) by mouth every 4 (four) hours as needed for severe pain. 30 tablet Ranay Ketter, Elita Boone, NP      I have reviewed the PDMP during this encounter.   Valinda Hoar, NP 03/08/21 1404

## 2021-03-08 NOTE — Discharge Instructions (Addendum)
He is return in 7 to 10 days for removal of hand stitches, 4 sutures placed today  May complete daily hygiene as normal, cleansing area with diluted soapy water, pat dry, may leave open to air  May complete activity as tolerated, please cover hand if there will be no known exposure to dirt or particles while at work  Please watch for signs of infection such as increased pain, increased swelling, drainage, fever or chills  If you begin to have numbness, tingling, limited movement please follow-up with orthopedic specialist

## 2021-03-08 NOTE — ED Triage Notes (Signed)
Pt has laceration on the top of his left hand. Accident occurred about an hour ago. He states he had sutures about 2-3 years ago also and was given a tetanus vaccine at the at time.

## 2021-03-24 ENCOUNTER — Ambulatory Visit: Payer: Self-pay

## 2021-03-24 DIAGNOSIS — Z419 Encounter for procedure for purposes other than remedying health state, unspecified: Secondary | ICD-10-CM | POA: Diagnosis not present

## 2021-03-24 NOTE — Telephone Encounter (Signed)
°  Chief Complaint: sweating Symptoms: sweating, dizziness, hands turned colors Frequency: today for few mins Pertinent Negatives: Patient denies symptoms being present Disposition: [] ED /[] Urgent Care (no appt availability in office) / [x] Appointment(In office/virtual)/ []  [facility]/ [] Home Care/ [] Refused Recommended Disposition /[] [facility]/ []  Follow-up with PCP Additional Notes: Pt states he went into store and broke out into a sweat got dizzy feeling and noticed his hands turning a greenish color, ate a candy bar and drank a gatorade and feels better now. Pt denies having BS issues or being DM. Advised pt to monitor symptoms if they occur again and if feeling like going to faint go to ED to be seen.    Reason for Disposition  [1] MODERATE sweating (e.g., interferes with normal activities like work or school) AND [2] cause unknown AND [3]  new-onset in the last 4 weeks  Answer Assessment - Initial Assessment Questions 1. ONSET: "When did the sweating start?"      Few mins ago 2. LOCATION: "What part of your body has excessive sweating?" (e.g., entire body; just face, underarms, palms, or soles of feet).      Sweating on forehead  3. SEVERITY: "How bad is the sweating?"    (Scale 1-10; or mild, moderate, severe)   -  MILD (1-3): doesn't interfere with normal activities    -  MODERATE (4-7): interferes with normal activities (e.g., work or school) or awakens from sleep; causes embarrassment in social situations    -  SEVERE (8-10): drenching sweats and has to change bed clothes or bed linens.     Mild  5. FEVER: "Have you been having fevers?"     no 6. OTHER SYMPTOMS: "Do you have any other symptoms?" (e.g., chest pain, difficulty breathing, lightheadedness, weight loss)     Dizziness,  Protocols used: Sweating-A-AH

## 2021-03-26 ENCOUNTER — Ambulatory Visit: Payer: Medicaid Other | Admitting: Internal Medicine

## 2021-03-26 ENCOUNTER — Encounter: Payer: Self-pay | Admitting: Internal Medicine

## 2021-03-26 VITALS — BP 106/72 | HR 91 | Temp 98.0°F | Resp 16 | Ht 74.0 in | Wt 262.3 lb

## 2021-03-26 DIAGNOSIS — R55 Syncope and collapse: Secondary | ICD-10-CM | POA: Diagnosis not present

## 2021-03-26 DIAGNOSIS — R42 Dizziness and giddiness: Secondary | ICD-10-CM | POA: Diagnosis not present

## 2021-03-26 DIAGNOSIS — Z87898 Personal history of other specified conditions: Secondary | ICD-10-CM | POA: Diagnosis not present

## 2021-03-26 LAB — CBC WITH DIFFERENTIAL/PLATELET
Absolute Monocytes: 360 cells/uL (ref 200–950)
Basophils Absolute: 18 cells/uL (ref 0–200)
Basophils Relative: 0.3 %
Eosinophils Absolute: 48 cells/uL (ref 15–500)
Eosinophils Relative: 0.8 %
HCT: 44.8 % (ref 38.5–50.0)
Hemoglobin: 15 g/dL (ref 13.2–17.1)
Lymphs Abs: 3000 cells/uL (ref 850–3900)
MCH: 28.4 pg (ref 27.0–33.0)
MCHC: 33.5 g/dL (ref 32.0–36.0)
MCV: 84.7 fL (ref 80.0–100.0)
MPV: 11.7 fL (ref 7.5–12.5)
Monocytes Relative: 6 %
Neutro Abs: 2574 cells/uL (ref 1500–7800)
Neutrophils Relative %: 42.9 %
Platelets: 237 10*3/uL (ref 140–400)
RBC: 5.29 10*6/uL (ref 4.20–5.80)
RDW: 13.4 % (ref 11.0–15.0)
Total Lymphocyte: 50 %
WBC: 6 10*3/uL (ref 3.8–10.8)

## 2021-03-26 LAB — COMPLETE METABOLIC PANEL WITH GFR
AG Ratio: 1.4 (calc) (ref 1.0–2.5)
ALT: 28 U/L (ref 9–46)
AST: 18 U/L (ref 10–40)
Albumin: 4.5 g/dL (ref 3.6–5.1)
Alkaline phosphatase (APISO): 73 U/L (ref 36–130)
BUN: 17 mg/dL (ref 7–25)
CO2: 30 mmol/L (ref 20–32)
Calcium: 9.9 mg/dL (ref 8.6–10.3)
Chloride: 106 mmol/L (ref 98–110)
Creat: 1.11 mg/dL (ref 0.60–1.24)
Globulin: 3.2 g/dL (calc) (ref 1.9–3.7)
Glucose, Bld: 81 mg/dL (ref 65–99)
Potassium: 4.3 mmol/L (ref 3.5–5.3)
Sodium: 141 mmol/L (ref 135–146)
Total Bilirubin: 0.4 mg/dL (ref 0.2–1.2)
Total Protein: 7.7 g/dL (ref 6.1–8.1)
eGFR: 93 mL/min/{1.73_m2} (ref >=60)

## 2021-03-26 LAB — TSH: TSH: 2.57 mIU/L (ref 0.40–4.50)

## 2021-03-26 NOTE — Progress Notes (Signed)
Acute Office Visit  Subjective:    Patient ID: Mathew Doyle, male    DOB: Sep 11, 1990, 31 y.o.   MRN: [id number]  Chief Complaint  Patient presents with   Dizziness    Yesterday had an episode, with sweating and hands changing colors    HPI Patient is in today for concern for hypoglycemia. Event occurred yesterday, was standing in line, felt very dizzy. Sat down, drank  Gatorade and ate candy bar and the symptoms resolved. Lasted 7-10 minutes. Felt really hot and felt like was going to pass out. Noted it was hot in the store. Never had anything like this before. Does have a history of prolonged QT interval and say Cardiology, doesn't see them anymore. Doesn't take any daily medications.   DIZZINESS Duration:  once yesterday Description of symptoms:  presyncope  Duration of episode: minutes Dizziness frequency: no history of the same Provoking factors: none Aggravating factors:  none Recent head injury: no Recent or current viral symptoms: no History of vasovagal episodes: no Nausea: no Vomiting: no Tinnitus: no Hearing loss: no Aural fullness: no Headache: no Postural instability: no Diplopia, dysarthria, dysphagia or weakness: no Related to exertion: no Pallor: no Diaphoresis: yes Dyspnea: no Chest pain: no   Past Medical History:  Diagnosis Date   Chest pain of uncertain etiology [id number]   Complication of anesthesia    pt woke up during surgery, 2010   Long Q-T syndrome    Long QT interval 03/13/2018    Past Surgical History:  Procedure Laterality Date   CIRCUMCISION     MANDIBLE FRACTURE SURGERY  2010    Family History  Problem Relation Age of Onset   Long QT syndrome Father    Heart disease Father    Prostate cancer Maternal Grandfather     Social History   Socioeconomic History   Marital status: Married    Spouse name: christine   Number of children: 2   Years of education: Not on file   Highest education level: Not on file  Occupational  History   Occupation: [employer]er  Tobacco Use   Smoking status: Former    Types: Cigarettes   Smokeless tobacco: Never  Vaping Use   Vaping Use: Never used  Substance and Sexual Activity   Alcohol use: Yes    Comment: occasional   Drug use: Never   Sexual activity: Yes    Partners: Female  Other Topics Concern   Not on file  Social History Narrative   Not on file   Social Determinants of Health   Financial Resource Strain: Not on file  Food Insecurity: Not on file  Transportation Needs: Not on file  Physical Activity: Not on file  Stress: Not on file  Social Connections: Not on file  Intimate Partner Violence: Not on file    Outpatient Medications Prior to Visit  Medication Sig Dispense Refill   oxyCODONE (OXY IR/ROXICODONE) 5 MG immediate release tablet Take 1 tablet (5 mg total) by mouth every 4 (four) hours as needed for severe pain. 30 tablet 0   No facility-administered medications prior to visit.    No Known Allergies  Review of Systems  Constitutional:  Positive for diaphoresis. Negative for chills and fever.  HENT:  Negative for congestion, sinus pressure and sinus pain.   Eyes:  Negative for visual disturbance.  Respiratory:  Negative for cough and shortness of breath.   Cardiovascular:  Negative for chest pain and palpitations.  Gastrointestinal:  Negative for abdominal  pain, diarrhea, nausea and vomiting.  Neurological:  Positive for dizziness, weakness and light-headedness. Negative for headaches.      Objective:    Physical Exam Constitutional:      Appearance: Normal appearance.  HENT:     Head: Normocephalic and atraumatic.     Right Ear: Tympanic membrane, ear canal and external ear normal.     Left Ear: Tympanic membrane, ear canal and external ear normal.  Eyes:     Conjunctiva/sclera: Conjunctivae normal.  Cardiovascular:     Rate and Rhythm: Normal rate and regular rhythm.  Pulmonary:     Effort: Pulmonary effort is normal.      Breath sounds: Normal breath sounds.  Musculoskeletal:     Right lower leg: No edema.     Left lower leg: No edema.  Skin:    General: Skin is warm and dry.  Neurological:     General: No focal deficit present.     Mental Status: He is alert. Mental status is at baseline.     Gait: Gait normal.  Psychiatric:        Mood and Affect: Mood normal.        Behavior: Behavior normal.    BP 106/72    Pulse 91    Temp 98 F (36.7 C)    Resp 16    Ht 6\' 2"  (1.88 m)    Wt 262 lb 4.8 oz (119 kg)    SpO2 98%    BMI 33.68 kg/m  Wt Readings from Last 3 Encounters:  03/08/21 268 lb 11.9 oz (121.9 kg)  12/16/20 268 lb 12.8 oz (121.9 kg)  01/14/20 266 lb 14.4 oz (121.1 kg)    Health Maintenance Due  Topic Date Due   COVID-19 Vaccine (1) Never done   Hepatitis C Screening  Never done   TETANUS/TDAP  Never done   INFLUENZA VACCINE  Never done    There are no preventive care reminders to display for this patient.   No results found for: TSH Lab Results  Component Value Date   WBC 7.2 08/20/2018   HGB 14.6 08/20/2018   HCT 42.8 08/20/2018   MCV 84.1 08/20/2018   PLT 302 08/20/2018   Lab Results  Component Value Date   NA 138 08/20/2018   K 4.3 08/20/2018   CO2 23 08/20/2018   GLUCOSE 89 08/20/2018   BUN 16 08/20/2018   CREATININE 1.05 08/20/2018   BILITOT 0.4 06/21/2018   ALKPHOS 75 06/21/2018   AST 22 06/21/2018   ALT 24 06/21/2018   PROT 8.4 (H) 06/21/2018   ALBUMIN 4.6 06/21/2018   CALCIUM 9.5 08/20/2018   ANIONGAP 9 08/20/2018   Lab Results  Component Value Date   CHOL 128 03/13/2018   Lab Results  Component Value Date   HDL 41 03/13/2018   Lab Results  Component Value Date   LDLCALC 74 03/13/2018   Lab Results  Component Value Date   TRIG 51 03/13/2018   Lab Results  Component Value Date   CHOLHDL 3.1 03/13/2018   No results found for: HGBA1C     Assessment & Plan:   1. Postural dizziness with presyncope/History of prolonged Q-T interval on ECG:  Event happened once, most likely orthostatic or hypoglycemia. Will check blood work today. EKG today reassuring, showing normal sinus rhythm with normal QT interval, no previous EKGs to compare. Discussed staying well hydrated and checking blood pressure at home. Follow up as needed.   - CBC w/Diff/Platelet -  COMPLETE METABOLIC PANEL WITH GFR - TSH - EKG 12-Lead  Teodora Medici, DO

## 2021-03-26 NOTE — Patient Instructions (Addendum)
It was great seeing you today!  Plan discussed at today's visit: -Blood work ordered today, results will be uploaded to MyChart.  -EKG today as well, reassuring QT interval within normal  -Continue to stay well hydrated and obtain a blood pressure cuff to check at home   Follow up in: as needed   Take care and let us know if you have any questions or concerns prior to your next visit.  Dr. Caralee Ates   Near-Syncope Outline of the head showing blood vessels that supply the brain.  Near-syncope is when you suddenly feel like you might pass out or faint, but you do not actually lose consciousness. This may also be referred to as presyncope. During an episode of near-syncope, you may: Feel dizzy, weak, light-headed, or like the room is spinning. Feel nauseous. See spots or see all white or all black in your field of vision. Have cold, clammy skin or feel warm and sweaty. Hear ringing in your ears (tinnitus). This condition is caused by a sudden decrease in blood flow to the brain. This decrease can result from various causes, but most of those causes are not dangerous. However, near-syncope may be a sign of a serious medical problem, so it is important to seek medical care. Follow these instructions at home: Medicines Take over-the-counter and prescription medicines only as told by your health care provider. If you are taking blood pressure or heart medicine, get up slowly and take several minutes to sit and then stand. This can reduce dizziness and decrease the risk of near-syncope. Lifestyle Do not drive, use machinery, or play sports until your health care provider says it is okay. Do not drink alcohol. Do not use any products that contain nicotine or tobacco. These products include cigarettes, chewing tobacco, and vaping devices, such as e-cigarettes. If you need help quitting, ask your health care provider. Avoid hot tubs and saunas. General instructions Pay attention to any changes  in your symptoms. Talk with your health care provider about your symptoms. You may need to have testing to understand the cause of your near-syncope. If you start to feel like you might faint, sit or lie down right away. If sitting, put your head down between your legs. If lying down, raise (elevate) your feet above the level of your heart. Breathe deeply and steadily. Wait until all of the symptoms have passed. Have someone stay with you until you feel stable. Drink enough fluid to keep your urine pale yellow. Avoid prolonged standing. If you must stand for a long time, do movements such as: Moving your legs. Crossing your legs. Flexing and stretching your leg muscles. Squatting. Keep all follow-up visits. This is important. Contact a health care provider if: You continue to have episodes of near fainting. Get help right away if: You faint. You have any of these symptoms that may indicate trouble with your heart: Fast or irregular heartbeats (palpitations). Unusual pain in your chest, abdomen, or back. Shortness of breath. You have a seizure. You have a severe headache. You are confused. You have vision problems. You have severe weakness or trouble walking. You are bleeding from your mouth or rectum, or have black or tarry stool. These symptoms may represent a serious problem that is an emergency. Do not wait to see if your symptoms will go away. Get medical help right away. Call your local emergency services (911 in the [place] Do not drive yourself to the hospital. Summary Near-syncope is when you suddenly feel like you  might pass out or faint, but you do not actually lose consciousness. This condition is caused by a sudden decrease in blood flow to the brain. This decrease can result from various causes, but most of those causes are not dangerous. Near-syncope may be a sign of a serious medical problem, so it is important to seek medical care. If you start to feel like you might  faint, sit or lie down right away. If sitting, put your head down between your legs. If lying down, raise (elevate) your feet above the level of your heart. Talk with your health care provider about your symptoms. You may need to have testing to understand the cause of your near-syncope. This information is not intended to replace advice given to you by your health care provider. Make sure you discuss any questions you have with your health care provider. Document Revised: 06/18/2020 Document Reviewed: 06/18/2020 Elsevier Patient Education  2022 [employer].

## 2021-04-21 DIAGNOSIS — Z419 Encounter for procedure for purposes other than remedying health state, unspecified: Secondary | ICD-10-CM | POA: Diagnosis not present

## 2021-05-22 DIAGNOSIS — Z419 Encounter for procedure for purposes other than remedying health state, unspecified: Secondary | ICD-10-CM | POA: Diagnosis not present

## 2021-06-16 ENCOUNTER — Emergency Department (HOSPITAL_COMMUNITY): Payer: Medicaid Other

## 2021-06-16 ENCOUNTER — Emergency Department (HOSPITAL_COMMUNITY)
Admission: EM | Admit: 2021-06-16 | Discharge: 2021-06-16 | Disposition: A | Discharge status: Home / Self Care | Payer: Medicaid Other | Attending: Emergency Medicine | Admitting: Emergency Medicine

## 2021-06-16 ENCOUNTER — Encounter (HOSPITAL_COMMUNITY): Payer: Self-pay | Admitting: Emergency Medicine

## 2021-06-16 ENCOUNTER — Other Ambulatory Visit: Payer: Self-pay

## 2021-06-16 DIAGNOSIS — S61212A Laceration without foreign body of right middle finger without damage to nail, initial encounter: Secondary | ICD-10-CM

## 2021-06-16 DIAGNOSIS — Z23 Encounter for immunization: Secondary | ICD-10-CM | POA: Diagnosis not present

## 2021-06-16 DIAGNOSIS — W312XXA Contact with powered woodworking and forming machines, initial encounter: Secondary | ICD-10-CM | POA: Diagnosis not present

## 2021-06-16 DIAGNOSIS — S61411A Laceration without foreign body of right hand, initial encounter: Secondary | ICD-10-CM | POA: Diagnosis not present

## 2021-06-16 DIAGNOSIS — S65502A Unspecified injury of blood vessel of right middle finger, initial encounter: Secondary | ICD-10-CM | POA: Diagnosis present

## 2021-06-16 DIAGNOSIS — S6990XA Unspecified injury of unspecified wrist, hand and finger(s), initial encounter: Secondary | ICD-10-CM | POA: Diagnosis not present

## 2021-06-16 DIAGNOSIS — S61216A Laceration without foreign body of right little finger without damage to nail, initial encounter: Secondary | ICD-10-CM | POA: Diagnosis not present

## 2021-06-16 DIAGNOSIS — S61214A Laceration without foreign body of right ring finger without damage to nail, initial encounter: Secondary | ICD-10-CM | POA: Diagnosis not present

## 2021-06-16 DIAGNOSIS — Z743 Need for continuous supervision: Secondary | ICD-10-CM | POA: Diagnosis not present

## 2021-06-16 MED ORDER — LIDOCAINE HCL (PF) 1 % IJ SOLN
10.0000 mL | Freq: Once | INTRAMUSCULAR | Status: AC
Start: 2021-06-16 — End: 2021-06-16
  Administered 2021-06-16: 10 mL
  Filled 2021-06-16: qty 10

## 2021-06-16 MED ORDER — OXYCODONE HCL 7.5 MG PO TABS: 7.5000 mg | ORAL_TABLET | Freq: Four times a day (QID) | ORAL | 0 refills | Status: DC | PRN

## 2021-06-16 MED ORDER — TETANUS-DIPHTH-ACELL PERTUSSIS 5-2.5-18.5 LF-MCG/0.5 IM SUSY
0.5000 mL | PREFILLED_SYRINGE | Freq: Once | INTRAMUSCULAR | Status: AC
Start: 2021-06-16 — End: 2021-06-16
  Administered 2021-06-16: 0.5 mL via INTRAMUSCULAR
  Filled 2021-06-16: qty 0.5

## 2021-06-16 MED ORDER — HYDROCODONE-ACETAMINOPHEN 5-325 MG PO TABS
1.0000 | ORAL_TABLET | Freq: Once | ORAL | Status: AC
Start: 2021-06-16 — End: 2021-06-16
  Administered 2021-06-16: 1 via ORAL
  Filled 2021-06-16: qty 1

## 2021-06-16 MED ORDER — MORPHINE SULFATE (PF) 4 MG/ML IV SOLN
4.0000 mg | Freq: Once | INTRAVENOUS | Status: AC
Start: 2021-06-16 — End: 2021-06-16
  Administered 2021-06-16: 4 mg via INTRAVENOUS
  Filled 2021-06-16: qty 1

## 2021-06-16 MED ORDER — OXYCODONE HCL 5 MG PO TABS: 5.0000 mg | ORAL_TABLET | ORAL | 0 refills | Status: DC | PRN

## 2021-06-16 NOTE — ED Notes (Signed)
Pt and family at bedside provided cleaning and wound care instructions. ?

## 2021-06-16 NOTE — Discharge Instructions (Addendum)
Follow-up with hand surgery ? ?If wound begins to bleed hold pressure ? ?X-rays do not show evidence of broken bones, dislocation ? ?The medication as prescribed. ? ?Make sure to elevated and ice the area to reduce swelling ?

## 2021-06-16 NOTE — ED Provider Notes (Addendum)
?[facility] ?Provider Note ? ? ?CSN: [id number] ?Arrival date & time: 06/16/21  1702 ? ?  ?History ? ?Chief Complaint  ?Patient presents with  ? Laceration  ? ? ?Alinda DoomsJacqueem Rambeau is a 31 y.o. male for evaluation of laceration to third and fourth digit few hours PTA on table saw.  [facility], subsequently el[facility]. Came here. Unknown last tetanus.  He has pain to the volar aspect 3rd and 5th digit.  Some decrease sensation to the distal aspect on third digit.  Rates his pain a 10/10.  Received Ancef with EMS.  Hx of prolonged QT interval.  ? ?HPI ? ?  ? ?Home Medications ?Prior to Admission medications   ?Medication Sig Start Date End Date Taking? Authorizing Provider  ?oxyCODONE 7.5 MG TABS Take 7.5 mg by mouth every 6 (six) hours as needed for severe pain. 06/16/21  Yes Macarius Ruark A, PA-C  ?   ? ?Allergies    ?Patient has no known allergies.   ? ?Review of Systems   ?Review of Systems  ?Constitutional: Negative.   ?HENT: Negative.    ?Respiratory: Negative.    ?Cardiovascular: Negative.   ?Gastrointestinal: Negative.   ?Genitourinary: Negative.   ?Musculoskeletal:   ?     Right 3rd and 5th digit  ?Skin:  Positive for wound.  ?Neurological: Negative.   ?All other systems reviewed and are negative. ? ?Physical Exam ?Updated Vital Signs ?BP 135/88   Pulse 89   Temp 97.8 ?F (36.6 ?C) (Oral)   Resp 17   Ht 6\' 2"  (1.88 m)   Wt 113.4 kg   SpO2 99%   BMI 32.10 kg/m?  ?Physical Exam ?Vitals and nursing note reviewed.  ?Constitutional:   ?   General: He is not in acute distress. ?   Appearance: He is well-developed. He is not ill-appearing, toxic-appearing or diaphoretic.  ?HENT:  ?   Head: Normocephalic and atraumatic.  ?Eyes:  ?   Pupils: Pupils are equal, round, and reactive to light.  ?Cardiovascular:  ?   Rate and Rhythm: Normal rate and regular rhythm.  ?   Pulses: Normal pulses.     ?     Radial pulses are 2+ on the right side and 2+ on the left  side.  ?Pulmonary:  ?   Effort: Pulmonary effort is normal. No respiratory distress.  ?Abdominal:  ?   General: There is no distension.  ?   Palpations: Abdomen is soft.  ?Musculoskeletal:     ?   General: Normal range of motion.  ?   Cervical back: Normal range of motion and neck supple.  ?   Comments: No nail bed injuryTenderness third and fifth digit right upper extremity, volar aspect DIP.  No pulsatile bleeding.  Decreased sensation ulnar aspect  DIP at digit #3. Able to flex and extend at digit  ?Skin: ?   General: Skin is warm and dry.  ?   Capillary Refill: Capillary refill takes less than 2 seconds.  ?   Comments: See picture in chart  ?Neurological:  ?   General: No focal deficit present.  ?   Mental Status: He is alert and oriented to person, place, and time.  ?   Comments: Decreased sensation ulnar distal aspect at 3rd digit  ? ? ? ? ? ? ? ?ED Results / Procedures / Treatments   ?Labs ?(all labs ordered are listed, but only abnormal results are displayed) ?Labs Reviewed -  No data to display ? ?EKG ?None ? ?Radiology ?DG Hand Complete Right ? ?Result Date: 06/16/2021 ?CLINICAL DATA:  Right hand laceration, trauma EXAM: RIGHT HAND - COMPLETE 3+ VIEW COMPARISON:  None. FINDINGS: Soft tissue injury of the right third and fifth digits compatible with lacerations. No underlying acute osseous finding, malalignment, or definite radiopaque foreign body. No joint abnormality. Healed deformity of the right fifth metacarpal from a remote boxer's fracture. IMPRESSION: Soft tissue injury/lacerations.  No acute osseous finding. Electronically Signed   By: Judie Petit.  Shick M.D.   On: 06/16/2021 17:27   ? ?Procedures ?Marland Kitchen.Laceration Repair ? ?Date/Time: 06/16/2021 7:19 PM ?Performed by: Ralph Leyden A, PA-C ?Authorized by: Linwood Dibbles, PA-C  ? ?Consent:  ?  Consent obtained:  Verbal ?  Consent given by:  Patient ?  Risks discussed:  Infection, need for additional repair, pain, poor cosmetic result and poor wound  healing ?  Alternatives discussed:  No treatment and delayed treatment ?Universal protocol:  ?  Procedure explained and questions answered to patient or proxy's satisfaction: yes   ?  Relevant documents present and verified: yes   ?  Test results available: yes   ?  Imaging studies available: yes   ?  Required blood products, implants, devices, and special equipment available: yes   ?  Site/side marked: yes   ?  Immediately prior to procedure, a time out was called: yes   ?  Patient identity confirmed:  Verbally with patient ?Anesthesia:  ?  Anesthesia method:  Nerve block ?  Block injection procedure:  Anatomic landmarks identified, anatomic landmarks palpated, introduced needle, negative aspiration for blood and incremental injection ?Laceration details:  ?  Location:  Finger ?  Finger location:  R long finger ?  Length (cm):  2 ?  Depth (mm):  5 ?Pre-procedure details:  ?  Preparation:  Patient was prepped and draped in usual sterile fashion and imaging obtained to evaluate for foreign bodies ?Exploration:  ?  Limited defect created (wound extended): no   ?  Hemostasis achieved with:  Direct pressure ?  Imaging obtained: x-ray   ?  Imaging outcome: foreign body not noted   ?  Wound extent: no foreign bodies/material noted, no muscle damage noted, no underlying fracture noted and no vascular damage noted   ?  Contaminated: no   ?Treatment:  ?  Area cleansed with:  Povidone-iodine ?  Amount of cleaning:  Extensive ?  Irrigation solution:  Sterile saline ?  Irrigation method:  Pressure wash ?  Debridement:  None ?  Undermining:  None ?Skin repair:  ?  Repair method:  Sutures ?  Suture size:  5-0 ?  Suture material:  Prolene ?  Suture technique:  Simple interrupted ?  Number of sutures:  7 ?Approximation:  ?  Approximation:  Close ?Repair type:  ?  Repair type:  Complex ?Post-procedure details:  ?  Dressing:  Non-adherent dressing ?  Procedure completion:  Tolerated well, no immediate complications ?Marland Kitchen.Laceration  Repair ? ?Date/Time: 06/16/2021 7:25 PM ?Performed by: Ralph Leyden A, PA-C ?Authorized by: Linwood Dibbles, PA-C  ? ?Consent:  ?  Consent obtained:  Verbal ?  Consent given by:  Patient ?  Risks discussed:  Infection, need for additional repair, pain, poor cosmetic result and poor wound healing ?  Alternatives discussed:  No treatment and delayed treatment ?Universal protocol:  ?  Procedure explained and questions answered to patient or proxy's satisfaction: yes   ?  Relevant documents present  and verified: yes   ?  Test results available: yes   ?  Imaging studies available: yes   ?  Required blood products, implants, devices, and special equipment available: yes   ?  Site/side marked: yes   ?  Immediately prior to procedure, a time out was called: yes   ?  Patient identity confirmed:  Verbally with patient ?Anesthesia:  ?  Anesthesia method:  Local infiltration and nerve block ?  Local anesthetic:  Lidocaine 1% w/o epi ?  Block anesthetic:  Lidocaine 1% w/o epi ?  Block injection procedure:  Anatomic landmarks palpated, introduced needle, negative aspiration for blood, incremental injection and anatomic landmarks identified ?  Block outcome:  Anesthesia achieved ?Laceration details:  ?  Location:  Finger ?  Finger location:  R small finger ?  Length (cm):  2 ?  Depth (mm):  4 ?Pre-procedure details:  ?  Preparation:  Imaging obtained to evaluate for foreign bodies and patient was prepped and draped in usual sterile fashion ?Exploration:  ?  Hemostasis achieved with:  Direct pressure ?  Imaging obtained: x-ray   ?  Imaging outcome: foreign body not noted   ?  Wound extent: no foreign bodies/material noted, no muscle damage noted, no underlying fracture noted and no vascular damage noted   ?  Contaminated: no   ?Treatment:  ?  Area cleansed with:  Povidone-iodine ?  Amount of cleaning:  Extensive ?  Irrigation solution:  Sterile saline ?  Irrigation method:  Pressure wash ?Skin repair:  ?  Repair method:   Sutures ?  Suture size:  5-0 ?  Suture material:  Prolene ?  Suture technique:  Simple interrupted ?  Number of sutures:  7 ?Approximation:  ?  Approximation:  Close ?Repair type:  ?  Repair type:  Complex ?Post-procedure

## 2021-06-16 NOTE — ED Triage Notes (Signed)
Pt BIB [facility] for lac to R hand. Pt was using a circular saw, seen at a smaller medical center and got his hand wrapped and drove home, pt said pain was still 10/10 and had excruciating pain and decreased sensation to fingers and decided to call EMS. Pt said he was able to see bone when the injury happened. fentanyl total and 2g ancef given by EMS. 18g R AC. Skin warm and dry.  ?

## 2021-06-17 ENCOUNTER — Ambulatory Visit: Payer: Self-pay

## 2021-06-17 ENCOUNTER — Telehealth: Payer: Self-pay

## 2021-06-17 NOTE — Telephone Encounter (Signed)
?  Chief Complaint: Pain ?Symptoms: Pain 10/10 ?Frequency: since yesterday ?Pertinent Negatives: Patient denies  ?Disposition: [] ED /[] Urgent Care (no appt availability in office) / [] Appointment(In office/virtual)/ []  [place] [facility]/ [] Home Care/ [] Refused Recommended Disposition /[] [place] [facility]/ [x]  Follow-up with PCP ?Additional Notes: Pt was seen at ED yesterday for a hand injury and was given medication for pain, roxicodone. ?Per wife the medication given is not strong enough. Pt is in extreme pain and needs something stronger.   ? ? ?Reason for Disposition ? [1] Prescription refill request for ESSENTIAL medicine (i.e., likelihood of harm to patient if not taken) AND [2] triager unable to refill per department policy ? ?Answer Assessment - Initial Assessment Questions ?1. DRUG NAME: "What medicine do you need to have refilled?" ?    Stronger pain medication ?2. REFILLS REMAINING: "How many refills are remaining?" (Note: The label on the medicine or pill bottle will show how many refills are remaining. If there are no refills remaining, then a renewal may be needed.) ?     ?3. EXPIRATION DATE: "What is the expiration date?" (Note: The label states when the prescription will expire, and thus can no longer be refilled.) ?     ?4. PRESCRIBING HCP: "Who prescribed it?" Reason: If prescribed by specialist, call should be referred to that group. ?     ?5. SYMPTOMS: "Do you have any symptoms?" ?    Pain ?6. PREGNANCY: "Is there any chance that you are pregnant?" "When was your last menstrual period?" ?    na ? ?Protocols used: Medication Refill and Renewal Call-A-AH ? ?

## 2021-06-17 NOTE — Telephone Encounter (Signed)
Transition Care Management Unsuccessful Follow-up Telephone Call ? ?Date of discharge and from where:  06/16/2021 ? ?Attempts:  1st Attempt ? ?Reason for unsuccessful TCM follow-up call:  Voice mail full ? ?  ?

## 2021-06-18 ENCOUNTER — Other Ambulatory Visit: Payer: Self-pay

## 2021-06-18 ENCOUNTER — Encounter: Payer: Self-pay | Admitting: Nurse Practitioner

## 2021-06-18 ENCOUNTER — Ambulatory Visit: Payer: Medicaid Other | Admitting: Nurse Practitioner

## 2021-06-18 VITALS — BP 120/80 | HR 80 | Temp 98.0°F | Resp 18 | Ht 74.0 in | Wt 269.0 lb

## 2021-06-18 DIAGNOSIS — S61216D Laceration without foreign body of right little finger without damage to nail, subsequent encounter: Secondary | ICD-10-CM | POA: Diagnosis not present

## 2021-06-18 DIAGNOSIS — S61212D Laceration without foreign body of right middle finger without damage to nail, subsequent encounter: Secondary | ICD-10-CM | POA: Diagnosis not present

## 2021-06-18 DIAGNOSIS — W312XXA Contact with powered woodworking and forming machines, initial encounter: Secondary | ICD-10-CM | POA: Diagnosis not present

## 2021-06-18 MED ORDER — GABAPENTIN 300 MG PO CAPS: 300.0000 mg | ORAL_CAPSULE | Freq: Two times a day (BID) | ORAL | 0 refills | Status: DC

## 2021-06-18 MED ORDER — OXYCODONE HCL 5 MG PO TABS: 5.0000 mg | ORAL_TABLET | ORAL | 0 refills | Status: DC | PRN

## 2021-06-18 NOTE — Progress Notes (Signed)
Formatting of this note is different from the original.  Images from the original note were not included.      BP 120/80   Pulse 80   Temp 98 F (36.7 C) (Oral)   Resp 18   Wt 269 lb (122 kg)   SpO2 98%   BMI 34.54 kg/m      Subjective:      Patient ID: Jeffrey Christensen, male    DOB: October 14, 1990, 31 y.o.   MRN: [id number]    HPI:  Jeffrey Christensen is a 31 y.o. male    Chief Complaint   Patient presents with    Follow-up     Right hand injury, cut from circular saw working on a floor     Er follow up/ contact with powered saw/ laceration of right middle finger/laceration of right little finger:  He was seen in the emergency department on 06/16/2021 for cutting his fingers on a powered saw.  Xray was performed which showed no fracture.  Middle right and little right finger were repaired in the emergency department. He was given ancef, tetanus was updated and he was given a prescription of oxycodone 7.5 mg every four hours (15). Patient is reporting that his pain is not being managed by the oxycodone.  He is supposed to follow up with Dr. Caralyn Guile who is an orthopedic in [place].  Discussed keeping hand elevated, applying ice and taking ibuprofen with the oxycodone to help with pain. Will send in a prescription for gabapentin to help with pain. He says he only has a couple of the oxycodone left. Discussed that I will give him  a one time prescription for a couple more days, but we will not be giving any more after this. Patient verbalized understanding.  Discussed the need to follow up with Dr. Caralyn Guile, phone number provided.     Relevant past medical, surgical, family and social history reviewed and updated as indicated. Interim medical history since our last visit reviewed.  Allergies and medications reviewed and updated.    Review of Systems    Constitutional: Negative for fever or weight change.   Respiratory: Negative for cough and shortness of breath.    Cardiovascular: Negative for chest pain or palpitations.    Gastrointestinal: Negative for abdominal pain, no bowel changes.   Musculoskeletal: Negative for gait problem or joint swelling.   Skin: Negative for rash. Laceration repair on right middle and right little finger positive for pain  Neurological: Negative for dizziness or headache.   No other specific complaints in a complete review of systems (except as listed in HPI above).       Objective:     BP 120/80   Pulse 80   Temp 98 F (36.7 C) (Oral)   Resp 18   Wt 269 lb (122 kg)   SpO2 98%   BMI 34.54 kg/m    Wt Readings from Last 3 Encounters:   06/18/21 269 lb (122 kg)   06/16/21 250 lb (113.4 kg)   03/26/21 262 lb 4.8 oz (119 kg)     Physical Exam    Constitutional: Patient appears well-developed and well-nourished. Obese  No distress.   HEENT: head atraumatic, normocephalic, pupils equal and reactive to light,  neck supple  Cardiovascular: Normal rate, regular rhythm and normal heart sounds.  No murmur heard. No BLE edema.  Pulmonary/Chest: Effort normal and breath sounds normal. No respiratory distress.  Abdominal: Soft.  There is no tenderness.  Right hand:  no signs  of infection, right middle and little finger with laceration repair.  Good PMS  Psychiatric: Patient has a normal mood and affect. behavior is normal. Judgment and thought content normal.   Results for orders placed or performed in visit on 03/26/21   CBC w/Diff/Platelet   Result Value Ref Range    WBC 6.0 3.8 - 10.8 Thousand/uL    RBC 5.29 4.20 - 5.80 Million/uL    Hemoglobin 15.0 13.2 - 17.1 g/dL    HCT 44.8 38.5 - 50.0 %    MCV 84.7 80.0 - 100.0 fL    MCH 28.4 27.0 - 33.0 pg    MCHC 33.5 32.0 - 36.0 g/dL    RDW 13.4 11.0 - 15.0 %    Platelets 237 140 - 400 Thousand/uL    MPV 11.7 7.5 - 12.5 fL    Neutro Abs 2,574 1,500 - 7,800 cells/uL    Lymphs Abs 3,000 850 - 3,900 cells/uL    Absolute Monocytes 360 200 - 950 cells/uL    Eosinophils Absolute 48 15 - 500 cells/uL    Basophils Absolute 18 0 - 200 cells/uL    Neutrophils Relative % 42.9 %     Total Lymphocyte 50.0 %    Monocytes Relative 6.0 %    Eosinophils Relative 0.8 %    Basophils Relative 0.3 %   COMPLETE METABOLIC PANEL WITH GFR   Result Value Ref Range    Glucose, Bld 81 65 - 99 mg/dL    BUN 17 7 - 25 mg/dL    Creat 1.11 0.60 - 1.24 mg/dL    eGFR 93 > OR = 60 mL/min/1.72m    BUN/Creatinine Ratio NOT APPLICABLE 6 - 22 (calc)    Sodium 141 135 - 146 mmol/L    Potassium 4.3 3.5 - 5.3 mmol/L    Chloride 106 98 - 110 mmol/L    CO2 30 20 - 32 mmol/L    Calcium 9.9 8.6 - 10.3 mg/dL    Total Protein 7.7 6.1 - 8.1 g/dL    Albumin 4.5 3.6 - 5.1 g/dL    Globulin 3.2 1.9 - 3.7 g/dL (calc)    AG Ratio 1.4 1.0 - 2.5 (calc)    Total Bilirubin 0.4 0.2 - 1.2 mg/dL    Alkaline phosphatase (APISO) 73 36 - 130 U/L    AST 18 10 - 40 U/L    ALT 28 9 - 46 U/L   TSH   Result Value Ref Range    TSH 2.57 0.40 - 4.50 mIU/L       Assessment & Plan:     1. Contact with powered saw as cause of accidental injury  Take ibuprofen  Apply ice for 20 minutes at least 6 times a day  Keep hand elevated above heart  - gabapentin (NEURONTIN) 300 MG capsule; Take 1 capsule (300 mg total) by mouth 2 (two) times daily.  Dispense: 60 capsule; Refill: 0  - oxyCODONE (ROXICODONE) 5 MG immediate release tablet; Take 1 tablet (5 mg total) by mouth every 4 (four) hours as needed for severe pain.  Dispense: 15 tablet; Refill: 0    2. Laceration of right middle finger repaired, without foreign body without damage to nail, subsequent encounter  Take ibuprofen  Apply ice for 20 minutes at least 6 times a day  Keep hand elevated above heart  - gabapentin (NEURONTIN) 300 MG capsule; Take 1 capsule (300 mg total) by mouth 2 (two) times daily.  Dispense: 60 capsule; Refill: 0  -  oxyCODONE (ROXICODONE) 5 MG immediate release tablet; Take 1 tablet (5 mg total) by mouth every 4 (four) hours as needed for severe pain.  Dispense: 15 tablet; Refill: 0    3. Laceration of right little finger repaired without foreign body without damage to nail, subsequent  encounter  Take ibuprofen  Apply ice for 20 minutes at least 6 times a day  Keep hand elevated above heart  - gabapentin (NEURONTIN) 300 MG capsule; Take 1 capsule (300 mg total) by mouth 2 (two) times daily.  Dispense: 60 capsule; Refill: 0  - oxyCODONE (ROXICODONE) 5 MG immediate release tablet; Take 1 tablet (5 mg total) by mouth every 4 (four) hours as needed for severe pain.  Dispense: 15 tablet; Refill: 0     Follow up plan:  Follow up with Dr. Caralyn Guile orthopedic      Electronically signed by Bo Merino, FNP at 06/18/2021  3:13 PM EDT

## 2021-06-18 NOTE — Progress Notes (Signed)
? ?BP 120/80   Pulse 80   Temp 98 ?F (36.7 ?C) (Oral)   Resp 18   Wt 269 lb (122 kg)   SpO2 98%   BMI 34.54 kg/m?   ? ?Subjective:  ? ? Patient ID: Mathew Doyle, male    DOB: 1990/10/12, 31 y.o.   MRN: [id number] ? ?HPI: ?Mathew Doyle is a 31 y.o. male ? ?Chief Complaint  ?Patient presents with  ? Follow-up  ?  Right hand injury, cut from circular saw working on a floor  ? ?Er follow up/ contact with powered saw/ laceration of right middle finger/laceration of right little finger:  He was seen in the emergency department on 06/16/2021 for cutting his fingers on a powered saw.  Xray was performed which showed no fracture.  Middle right and little right finger were repaired in the emergency department. He was given ancef, tetanus was updated and he was given a prescription of oxycodone 7.5 mg every four hours (15). Patient is reporting that his pain is not being managed by the oxycodone.  He is supposed to follow up with Dr. Caralyn Guile who is an orthopedic in [place].  Discussed keeping hand elevated, applying ice and taking ibuprofen with the oxycodone to help with pain. Will send in a prescription for gabapentin to help with pain. He says he only has a couple of the oxycodone left. Discussed that I will give him  a one time prescription for a couple more days, but we will not be giving any more after this. Patient verbalized understanding.  Discussed the need to follow up with Dr. Caralyn Guile, phone number provided.  ? ?Relevant past medical, surgical, family and social history reviewed and updated as indicated. Interim medical history since our last visit reviewed. ?Allergies and medications reviewed and updated. ? ?Review of Systems ? ?Constitutional: Negative for fever or weight change.  ?Respiratory: Negative for cough and shortness of breath.   ?Cardiovascular: Negative for chest pain or palpitations.  ?Gastrointestinal: Negative for abdominal pain, no bowel changes.  ?Musculoskeletal: Negative for gait  problem or joint swelling.  ?Skin: Negative for rash. Laceration repair on right middle and right little finger positive for pain ?Neurological: Negative for dizziness or headache.  ?No other specific complaints in a complete review of systems (except as listed in HPI above).  ? ?   ?Objective:  ?  ?BP 120/80   Pulse 80   Temp 98 ?F (36.7 ?C) (Oral)   Resp 18   Wt 269 lb (122 kg)   SpO2 98%   BMI 34.54 kg/m?   ?Wt Readings from Last 3 Encounters:  ?06/18/21 269 lb (122 kg)  ?06/16/21 250 lb (113.4 kg)  ?03/26/21 262 lb 4.8 oz (119 kg)  ?  ?Physical Exam ? ?Constitutional: Patient appears well-developed and well-nourished. Obese  No distress.  ?HEENT: head atraumatic, normocephalic, pupils equal and reactive to light,  neck supple ?Cardiovascular: Normal rate, regular rhythm and normal heart sounds.  No murmur heard. No BLE edema. ?Pulmonary/Chest: Effort normal and breath sounds normal. No respiratory distress. ?Abdominal: Soft.  There is no tenderness. ?Right hand:  no signs of infection, right middle and little finger with laceration repair.  Good PMS ?Psychiatric: Patient has a normal mood and affect. behavior is normal. Judgment and thought content normal.  ?Results for orders placed or performed in visit on 03/26/21  ?CBC w/Diff/Platelet  ?Result Value Ref Range  ? WBC 6.0 3.8 - 10.8 Thousand/uL  ? RBC 5.29 4.20 - 5.80 Million/uL  ?  Hemoglobin 15.0 13.2 - 17.1 g/dL  ? HCT 44.8 38.5 - 50.0 %  ? MCV 84.7 80.0 - 100.0 fL  ? MCH 28.4 27.0 - 33.0 pg  ? MCHC 33.5 32.0 - 36.0 g/dL  ? RDW 13.4 11.0 - 15.0 %  ? Platelets 237 140 - 400 Thousand/uL  ? MPV 11.7 7.5 - 12.5 fL  ? Neutro Abs 2,574 1,500 - 7,800 cells/uL  ? Lymphs Abs 3,000 850 - 3,900 cells/uL  ? Absolute Monocytes 360 200 - 950 cells/uL  ? Eosinophils Absolute 48 15 - 500 cells/uL  ? Basophils Absolute 18 0 - 200 cells/uL  ? Neutrophils Relative % 42.9 %  ? Total Lymphocyte 50.0 %  ? Monocytes Relative 6.0 %  ? Eosinophils Relative 0.8 %  ? Basophils  Relative 0.3 %  ?COMPLETE METABOLIC PANEL WITH GFR  ?Result Value Ref Range  ? Glucose, Bld 81 65 - 99 mg/dL  ? BUN 17 7 - 25 mg/dL  ? Creat 1.11 0.60 - 1.24 mg/dL  ? eGFR 93 > OR = 60 mL/min/1.47m  ? BUN/Creatinine Ratio NOT APPLICABLE 6 - 22 (calc)  ? Sodium 141 135 - 146 mmol/L  ? Potassium 4.3 3.5 - 5.3 mmol/L  ? Chloride 106 98 - 110 mmol/L  ? CO2 30 20 - 32 mmol/L  ? Calcium 9.9 8.6 - 10.3 mg/dL  ? Total Protein 7.7 6.1 - 8.1 g/dL  ? Albumin 4.5 3.6 - 5.1 g/dL  ? Globulin 3.2 1.9 - 3.7 g/dL (calc)  ? AG Ratio 1.4 1.0 - 2.5 (calc)  ? Total Bilirubin 0.4 0.2 - 1.2 mg/dL  ? Alkaline phosphatase (APISO) 73 36 - 130 U/L  ? AST 18 10 - 40 U/L  ? ALT 28 9 - 46 U/L  ?TSH  ?Result Value Ref Range  ? TSH 2.57 0.40 - 4.50 mIU/L  ? ?   ?Assessment & Plan:  ? ?1. Contact with powered saw as cause of accidental injury ?Take ibuprofen ?Apply ice for 20 minutes at least 6 times a day ?Keep hand elevated above heart ?- gabapentin (NEURONTIN) 300 MG capsule; Take 1 capsule (300 mg total) by mouth 2 (two) times daily.  Dispense: 60 capsule; Refill: 0 ?- oxyCODONE (ROXICODONE) 5 MG immediate release tablet; Take 1 tablet (5 mg total) by mouth every 4 (four) hours as needed for severe pain.  Dispense: 15 tablet; Refill: 0 ? ?2. Laceration of right middle finger repaired, without foreign body without damage to nail, subsequent encounter ?Take ibuprofen ?Apply ice for 20 minutes at least 6 times a day ?Keep hand elevated above heart ?- gabapentin (NEURONTIN) 300 MG capsule; Take 1 capsule (300 mg total) by mouth 2 (two) times daily.  Dispense: 60 capsule; Refill: 0 ?- oxyCODONE (ROXICODONE) 5 MG immediate release tablet; Take 1 tablet (5 mg total) by mouth every 4 (four) hours as needed for severe pain.  Dispense: 15 tablet; Refill: 0 ? ?3. Laceration of right little finger repaired without foreign body without damage to nail, subsequent encounter ?Take ibuprofen ?Apply ice for 20 minutes at least 6 times a day ?Keep hand elevated  above heart ?- gabapentin (NEURONTIN) 300 MG capsule; Take 1 capsule (300 mg total) by mouth 2 (two) times daily.  Dispense: 60 capsule; Refill: 0 ?- oxyCODONE (ROXICODONE) 5 MG immediate release tablet; Take 1 tablet (5 mg total) by mouth every 4 (four) hours as needed for severe pain.  Dispense: 15 tablet; Refill: 0  ? ?Follow up plan: ?Follow up  with Dr. Caralyn Guile orthopedic ? ? ? ? ? ?

## 2021-06-18 NOTE — Telephone Encounter (Signed)
Pt is scheduled with Raynelle Fanning for today ?

## 2021-06-18 NOTE — Telephone Encounter (Signed)
Wife calling back again in regard to pain medication. Please advise. ?

## 2021-06-18 NOTE — Telephone Encounter (Signed)
Transition Care Management Follow-up Telephone Call ?Date of discharge and from where: 06/16/2021 from [facility] ?How have you been since you were released from the hospital? Patient stated that he is doing okay. Patient did not have any questions or concerns at this time.  ?Any questions or concerns? No ? ?Items Reviewed: ?Did the pt receive and understand the discharge instructions provided? Yes  ?Medications obtained and verified? Yes  ?Other? No  ?Any new allergies since your discharge? No  ?Dietary orders reviewed? No ?Do you have support at home? Yes  ? ?Functional Questionnaire: (I = Independent and D = Dependent) ?ADLs: I ? ?Bathing/Dressing- I ? ?Meal Prep- I ? ?Eating- I ? ?Maintaining continence- I ? ?Transferring/Ambulation- I ? ?Managing Meds- I ? ? ?Follow up appointments reviewed: ? ?PCP Hospital f/u appt confirmed? No   ?Specialist Hospital f/u appt confirmed? No   ?Are transportation arrangements needed? No  ?If their condition worsens, is the pt aware to call PCP or go to the Emergency Dept.? Yes ?Was the patient provided with contact information for the PCP's office or ED? Yes ?Was to pt encouraged to call back with questions or concerns? Yes ? ?

## 2021-06-21 DIAGNOSIS — Z419 Encounter for procedure for purposes other than remedying health state, unspecified: Secondary | ICD-10-CM | POA: Diagnosis not present

## 2021-06-22 DIAGNOSIS — S61212A Laceration without foreign body of right middle finger without damage to nail, initial encounter: Secondary | ICD-10-CM | POA: Diagnosis not present

## 2021-07-13 DIAGNOSIS — S61212A Laceration without foreign body of right middle finger without damage to nail, initial encounter: Secondary | ICD-10-CM | POA: Diagnosis not present

## 2021-07-22 DIAGNOSIS — Z419 Encounter for procedure for purposes other than remedying health state, unspecified: Secondary | ICD-10-CM | POA: Diagnosis not present

## 2021-08-21 DIAGNOSIS — Z419 Encounter for procedure for purposes other than remedying health state, unspecified: Secondary | ICD-10-CM | POA: Diagnosis not present

## 2021-09-21 DIAGNOSIS — Z419 Encounter for procedure for purposes other than remedying health state, unspecified: Secondary | ICD-10-CM | POA: Diagnosis not present

## 2021-10-22 DIAGNOSIS — Z419 Encounter for procedure for purposes other than remedying health state, unspecified: Secondary | ICD-10-CM | POA: Diagnosis not present

## 2021-11-21 DIAGNOSIS — Z419 Encounter for procedure for purposes other than remedying health state, unspecified: Secondary | ICD-10-CM | POA: Diagnosis not present

## 2021-12-22 DIAGNOSIS — Z419 Encounter for procedure for purposes other than remedying health state, unspecified: Secondary | ICD-10-CM | POA: Diagnosis not present

## 2022-01-21 DIAGNOSIS — Z419 Encounter for procedure for purposes other than remedying health state, unspecified: Secondary | ICD-10-CM | POA: Diagnosis not present

## 2022-02-21 DIAGNOSIS — Z419 Encounter for procedure for purposes other than remedying health state, unspecified: Secondary | ICD-10-CM | POA: Diagnosis not present

## 2022-03-24 DIAGNOSIS — Z419 Encounter for procedure for purposes other than remedying health state, unspecified: Secondary | ICD-10-CM | POA: Diagnosis not present

## 2022-04-17 ENCOUNTER — Inpatient Hospital Stay
Admit: 2022-04-17 | Discharge: 2022-04-17 | Disposition: A | Discharge status: Home / Self Care | Payer: MEDICAID | Attending: Emergency Medicine

## 2022-04-17 DIAGNOSIS — M542 Cervicalgia: Secondary | ICD-10-CM

## 2022-04-17 MED ORDER — CYCLOBENZAPRINE HCL 10 MG PO TABS
10 | ORAL_TABLET | Freq: Three times a day (TID) | ORAL | 0 refills | Status: AC | PRN
Start: 2022-04-17 — End: 2022-04-24

## 2022-04-17 NOTE — ED Notes (Signed)
Patient discharged from ED, given instructions on after-care and information on prescriptions. Patient verbalized understanding, and was ambulatory out of ED with no issues. Discharge papers given to patient.

## 2022-04-17 NOTE — ED Provider Notes (Signed)
[facility] EMERGENCY DEPT  EMERGENCY DEPARTMENT ENCOUNTER      Pt Name: Jeffrey Christensen  MRN: [id number]  [place] 29-Feb-1990  Date of evaluation: 04/17/2022  Provider: Myles Rosenthal, [place]       Chief Complaint   Patient presents with    Neck Pain    Back Pain         HISTORY OF PRESENT ILLNESS   (Location/Symptom, Timing/Onset, Context/Setting, Quality, Duration, Modifying Factors, Severity)  Note limiting factors.   32 year old male comes in for 2 complaints.  He states he woke up this morning and his neck started hurting on the left side.  He denies numbness or tingling.  No fevers or chills.  No headache change in vision.  He notes that it hurts more when he moves.  He does work in [employer].  Patient also describes some right sided upper abdominal discomfort last night that went through his back.  He states that this happens from time to time.  Last night for dinner he endorsed eating mac & cheese fried chicken barbecue chicken amongst several other items.            Review of External Medical Records:     Nursing Notes were reviewed.    REVIEW OF SYSTEMS    (2-9 systems for level 4, 10 or more for level 5)     Review of Systems    Except as noted above the remainder of the review of systems was reviewed and negative.       PAST MEDICAL HISTORY     Past Medical History:   Diagnosis Date    Long Q-T syndrome          SURGICAL HISTORY     History reviewed. No pertinent surgical history.      CURRENT MEDICATIONS       Previous Medications    No medications on file       ALLERGIES     Patient has no known allergies.    FAMILY HISTORY     History reviewed. No pertinent family history.       SOCIAL HISTORY       Social History     Socioeconomic History    Marital status: Married     Spouse name: None    Number of children: None    Years of education: None    Highest education level: None   Tobacco Use    Smoking status: Never    Smokeless tobacco: Current   Substance and Sexual Activity    Alcohol  use: Yes    Drug use: Never           PHYSICAL EXAM    (up to 7 for level 4, 8 or more for level 5)     ED Triage Vitals [04/17/22 0827]   BP Temp Temp src Pulse Respirations SpO2 Height Weight - Scale   135/81 98.2 F (36.8 C) -- 61 16 100 % 1.88 m (6' 2"$ ) 122.5 kg (270 lb)       Body mass index is 34.67 kg/m.    Physical Exam  Vitals reviewed.   Constitutional:       Appearance: Normal appearance.   HENT:      Head: Normocephalic and atraumatic.   Cardiovascular:      Rate and Rhythm: Normal rate.   Pulmonary:      Effort: Pulmonary effort is normal.   Abdominal:      General:  Abdomen is flat.      Tenderness: There is no abdominal tenderness. There is no right CVA tenderness, left CVA tenderness or guarding.   Musculoskeletal:      Cervical back: Normal range of motion. No rigidity or tenderness.   Lymphadenopathy:      Cervical: No cervical adenopathy.   Neurological:      Mental Status: He is alert.         DIAGNOSTIC RESULTS     EKG: All EKG's are interpreted by the Emergency Department Physician who either signs or Co-signs this chart in the absence of a cardiologist.        RADIOLOGY:   Non-plain film images such as CT, Ultrasound and MRI are read by the radiologist. Plain radiographic images are visualized and preliminarily interpreted by the emergency physician with the below findings:        Interpretation per the Radiologist below, if available at the time of this note:    No orders to display        LABS:  Labs Reviewed - No data to display    All other labs were within normal range or not returned as of this dictation.    EMERGENCY DEPARTMENT COURSE and DIFFERENTIAL DIAGNOSIS/MDM:   Vitals:    Vitals:    04/17/22 0827   BP: 135/81   Pulse: 61   Resp: 16   Temp: 98.2 F (36.8 C)   SpO2: 100%   Weight: 122.5 kg (270 lb)   Height: 1.88 m (6' 2"$ )           Medical Decision Making  Well-appearing patient.  No signs of distress.  As for his neck, he has no meningeal signs he has full range of motion with  no restriction.  He has no neurological symptoms.  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  Will trial Lexapro.  Patient also describes some right upper quadrant abdominal pain through his back last night after eating fatty meal.  Possible that he has biliary colic.  I do not feel the need to do any blood work or advanced imaging at this time given the lack of symptoms.  I have asked that he keep track of his pain and see if he can associate with anything he eats and follow-up with his PCP when he returns to [facility] for additional testing as necessary    Risk  Prescription drug management.            REASSESSMENT            CONSULTS:  None    PROCEDURES:  Unless otherwise noted below, none     Procedures      FINAL IMPRESSION      1. Neck pain          DISPOSITION/PLAN   DISPOSITION Decision To Discharge 04/17/2022 08:37:11 AM      PATIENT REFERRED TO:  Your PCP    Schedule an appointment as soon as possible for a visit         DISCHARGE MEDICATIONS:  New Prescriptions    CYCLOBENZAPRINE (FLEXERIL) 10 MG TABLET    Take 0.5-1 tablets by mouth 3 times daily as needed for Muscle spasms         (Please note that portions of this note were completed with a voice recognition program.  Efforts were made to edit the dictations but occasionally words are mis-transcribed.)    Myles Rosenthal, DO (electronically  signed)  Emergency Attending Physician / Physician Assistant / Nurse Practitioner             Myles Rosenthal, DO  04/17/22 [phone]

## 2022-04-17 NOTE — ED Triage Notes (Signed)
Patient arrives to ED ambulatory w/o difficulty. No acute distress noted in triage. A&O x 4. Skin is warm, dry & intact on obs. Pt reports right flank pain and  left neck pain that started last night.

## 2022-04-22 DIAGNOSIS — Z419 Encounter for procedure for purposes other than remedying health state, unspecified: Secondary | ICD-10-CM | POA: Diagnosis not present

## 2022-04-25 NOTE — Patient Instructions (Signed)
Preventive Care 40-30 Years Old, Male Preventive care refers to lifestyle choices and visits with your health care provider that can promote health and wellness. Preventive care visits are also called wellness exams. What can I expect for my preventive care visit? Counseling During your preventive care visit, your health care provider may ask about your: Medical history, including: Past medical problems. Family medical history. Current health, including: Emotional well-being. Home life and relationship well-being. Sexual activity. Lifestyle, including: Alcohol, nicotine or tobacco, and drug use. Access to firearms. Diet, exercise, and sleep habits. Safety issues such as seatbelt and bike helmet use. Sunscreen use. Work and work Statistician. Physical exam Your health care provider may check your: Height and weight. These may be used to calculate your BMI (body mass index). BMI is a measurement that tells if you are at a healthy weight. Waist circumference. This measures the distance around your waistline. This measurement also tells if you are at a healthy weight and may help predict your risk of certain diseases, such as type 2 diabetes and high blood pressure. Heart rate and blood pressure. Body temperature. Skin for abnormal spots. What immunizations do I need?  Vaccines are usually given at various ages, according to a schedule. Your health care provider will recommend vaccines for you based on your age, medical history, and lifestyle or other factors, such as travel or where you work. What tests do I need? Screening Your health care provider may recommend screening tests for certain conditions. This may include: Lipid and cholesterol levels. Diabetes screening. This is done by checking your blood sugar (glucose) after you have not eaten for a while (fasting). Hepatitis B test. Hepatitis C test. HIV (human immunodeficiency virus) test. STI (sexually transmitted infection)  testing, if you are at risk. Talk with your health care provider about your test results, treatment options, and if necessary, the need for more tests. Follow these instructions at home: Eating and drinking  Eat a healthy diet that includes fresh fruits and vegetables, whole grains, lean protein, and low-fat dairy products. Drink enough fluid to keep your urine pale yellow. Take vitamin and mineral supplements as recommended by your health care provider. Do not drink alcohol if your health care provider tells you not to drink. If you drink alcohol: Limit how much you have to 0-2 drinks a day. Know how much alcohol is in your drink. In the [place] one drink equals one 12 oz bottle of beer (355 mL), one 5 oz glass of wine (148 mL), or one 1 oz glass of hard liquor (44 mL). Lifestyle Brush your teeth every morning and night with fluoride toothpaste. Floss one time each day. Exercise for at least 30 minutes 5 or more days each week. Do not use any products that contain nicotine or tobacco. These products include cigarettes, chewing tobacco, and vaping devices, such as e-cigarettes. If you need help quitting, ask your health care provider. Do not use drugs. If you are sexually active, practice safe sex. Use a condom or other form of protection to prevent STIs. Find healthy ways to manage stress, such as: Meditation, yoga, or listening to music. Journaling. Talking to a trusted person. Spending time with friends and family. Minimize exposure to UV radiation to reduce your risk of skin cancer. Safety Always wear your seat belt while driving or riding in a vehicle. Do not drive: If you have been drinking alcohol. Do not ride with someone who has been drinking. If you have been using any mind-altering substances  or drugs. While texting. When you are tired or distracted. Wear a helmet and other protective equipment during sports activities. If you have firearms in your house, make sure you  follow all gun safety procedures. Seek help if you have been physically or sexually abused. What's next? Go to your health care provider once a year for an annual wellness visit. Ask your health care provider how often you should have your eyes and teeth checked. Stay up to date on all vaccines. This information is not intended to replace advice given to you by your health care provider. Make sure you discuss any questions you have with your health care provider. Document Revised: 08/05/2020 Document Reviewed: 08/05/2020 Elsevier Patient Education  [place].

## 2022-04-26 ENCOUNTER — Ambulatory Visit (INDEPENDENT_AMBULATORY_CARE_PROVIDER_SITE_OTHER): Payer: Medicaid Other | Admitting: Family Medicine

## 2022-04-26 ENCOUNTER — Encounter: Payer: Self-pay | Admitting: Family Medicine

## 2022-04-26 ENCOUNTER — Other Ambulatory Visit (HOSPITAL_COMMUNITY)
Admission: RE | Admit: 2022-04-26 | Discharge: 2022-04-26 | Disposition: A | Discharge status: Home / Self Care | Payer: Medicaid Other | Source: Ambulatory Visit | Attending: Family Medicine | Admitting: Family Medicine

## 2022-04-26 VITALS — BP 114/76 | HR 73 | Temp 97.8°F | Resp 16 | Ht 74.0 in | Wt 257.9 lb

## 2022-04-26 DIAGNOSIS — Z0000 Encounter for general adult medical examination without abnormal findings: Secondary | ICD-10-CM | POA: Diagnosis not present

## 2022-04-26 DIAGNOSIS — Z113 Encounter for screening for infections with a predominantly sexual mode of transmission: Secondary | ICD-10-CM | POA: Diagnosis not present

## 2022-04-26 DIAGNOSIS — Z1159 Encounter for screening for other viral diseases: Secondary | ICD-10-CM | POA: Diagnosis not present

## 2022-04-26 NOTE — Progress Notes (Unsigned)
Patient: Mathew Doyle, Male    DOB: May 28, 1990, 32 y.o.   MRN: [id number] Delsa Grana, PA-C Visit Date: 04/26/2022  Today's Provider: Delsa Grana, PA-C   Chief Complaint  Patient presents with   Annual Exam   Subjective:   Annual physical exam:  Mathew Doyle is a 32 y.o. male who presents today for health maintenance and annual & complete physical exam.   Exercise/Activity:  active, wants to start exercising Diet/nutrition:  no diet efforts Sleep:  no concerns  SDOH Screenings   Food Insecurity: No Food Insecurity (04/26/2022)  Housing: Low Risk  (04/26/2022)  Transportation Needs: No Transportation Needs (04/26/2022)  Utilities: Not At Risk (04/26/2022)  Alcohol Screen: Low Risk  (06/18/2021)  Depression (PHQ2-9): Low Risk  (04/26/2022)  Financial Resource Strain: Low Risk  (04/26/2022)  Physical Activity: Sufficiently Active (04/26/2022)  Social Connections: Moderately Isolated (04/26/2022)  Stress: No Stress Concern Present (04/26/2022)  Tobacco Use: Low Risk  (04/26/2022)  Recent Concern: Tobacco Use - Medium Risk (04/26/2022)    Pt wished to discuss acute complaints - various episodes of brief pain, he's worried about health in general, wanted to get checked out   USPSTF grade A and B recommendations - reviewed and addressed today  Depression:  Phq 9 completed today by patient, was reviewed by me with patient in the room, score is  negative, pt feels good    04/26/2022    8:21 AM 06/18/2021    2:54 PM 03/26/2021   11:20 AM  Depression screen PHQ 2/9  Decreased Interest 0 0 0  Down, Depressed, Hopeless 0 0 0  PHQ - 2 Score 0 0 0  Altered sleeping 0  0  Tired, decreased energy 0  0  Change in appetite 0  0  Feeling bad or failure about yourself  0  0  Trouble concentrating 0  0  Moving slowly or fidgety/restless 0  0  Suicidal thoughts 0  0  PHQ-9 Score 0  0  Difficult doing work/chores Not difficult at all  Not difficult at all    Hep C Screening: due  STD testing and  prevention (HIV/chl/gon/syphilis): one partner  Intimate partner violence:  safe  Advanced Care Planning:  A voluntary discussion about advance care planning including the explanation and discussion of advance directives.    Health Maintenance  Topic Date Due   Hepatitis C Screening  Never done   COVID-19 Vaccine (1) 05/12/2022 (Originally 06/24/1993)   INFLUENZA VACCINE  05/22/2022 (Originally 09/21/2021)   DTaP/Tdap/Td (2 - Td or Tdap) 06/17/2031   HIV Screening  Completed   HPV VACCINES  Aged Out    Skin cancer:   last skin survey was.  Pt reports no hx of skin cancer, suspicious lesions/biopsies in the past.  Colorectal cancer:  colonoscopy is not due per age Pt denies blood in stool, change in bowels weight loss  Prostate cancer: none Prostate cancer screening with PSA: Discussed risks and benefits of PSA testing and provided handout. Pt declines to have PSA drawn today. No results found for: "PSA"  Urinary Symptoms:  not done per age  Lung cancer:   Low Dose CT Chest recommended if Age 47-80 years, 20 pack-year currently smoking OR have quit w/in 15years. Patient does not qualify.   Social History   Tobacco Use   Smoking status: Never   Smokeless tobacco: Never  Substance Use Topics   Alcohol use: Yes    Comment: occasional     Alcohol screening: Flowsheet  Row [facility] from [facility]/18/[facility]  AUDIT-C Score 0       AAA:  The USPSTF recommends one-time screening with ultrasonography in men ages 21 to 51 years who have ever smoked  ECG: not indicated   Blood pressure/Hypertension: BP Readings from Last 3 Encounters:  04/26/22 114/76  06/18/21 120/80  06/16/21 136/87   Weight/Obesity: Wt Readings from Last 3 Encounters:  04/26/22 257 lb 14.4 oz (117 kg)  06/18/21 269 lb (122 kg)  06/16/21 250 lb (113.4 kg)   BMI Readings from Last 3 Encounters:  04/26/22 33.11 kg/m  06/18/21 34.54 kg/m  06/16/21 32.10 kg/m     Lipids:  Lab Results  Component Value Date   CHOL 128 03/13/2018   Lab Results  Component Value Date   HDL 41 03/13/2018   Lab Results  Component Value Date   LDLCALC 74 03/13/2018   Lab Results  Component Value Date   TRIG 51 03/13/2018   Lab Results  Component Value Date   CHOLHDL 3.1 03/13/2018   No results found for: "LDLDIRECT" Based on the results of lipid panel his/her cardiovascular risk factor ( using [place] )  in the next 10 years is : The ASCVD Risk score (Arnett DK, et al., 2019) failed to calculate for the following reasons:   The 2019 ASCVD risk score is only valid for ages 17 to 38 Glucose:  Glucose, Bld  Date Value Ref Range Status  03/26/2021 81 65 - 99 mg/dL Final    Comment:    .            Fasting reference interval .   08/20/2018 89 70 - 99 mg/dL Final  06/21/2018 91 70 - 99 mg/dL Final    Social History       Social History   Socioeconomic History   Marital status: Married    Spouse name: christine   Number of children: 2   Years of education: Not on file   Highest education level: Not on file  Occupational History   Occupation: Civil Service fast streamer  Tobacco Use   Smoking status: Never   Smokeless tobacco: Never  Vaping Use   Vaping Use: Every day  Substance and Sexual Activity   Alcohol use: Yes    Comment: occasional   Drug use: Never   Sexual activity: Yes    Partners: Female  Other Topics Concern   Not on file  Social History Narrative   Not on file   Social Determinants of Health   Financial Resource Strain: Low Risk  (04/26/2022)   Overall Financial Resource Strain (CARDIA)    Difficulty of Paying Living Expenses: Not hard at all  Food Insecurity: No Food Insecurity (04/26/2022)   Hunger Vital Sign    Worried About Running Out of Food in the Last Year: Never true    [place] in the Last Year: Never true  Transportation Needs: No Transportation Needs (04/26/2022)   PRAPARE - Radiographer, therapeutic (Medical): No    Lack of Transportation (Non-Medical): No  Physical Activity: Sufficiently Active (04/26/2022)   Exercise Vital Sign    Days of Exercise per Week: 4 days    Minutes of Exercise per Session: 70 min  Stress: No Stress Concern Present (04/26/2022)   [place]    Feeling of Stress : Not at all  Social Connections: Moderately Isolated (04/26/2022)  Social Licensed conveyancer [NHANES]    Frequency of Communication with Friends and Family: More than three times a week    Frequency of Social Gatherings with Friends and Family: Once a week    Attends Religious Services: Never    Marine scientist or Organizations: No    Attends Music therapist: Never    Marital Status: Married    Family History        Family History  Problem Relation Age of Onset   Long QT syndrome Father    Heart disease Father    Prostate cancer Maternal Grandfather     Patient Active Problem List   Diagnosis Date Noted   History of prolonged Q-T interval on ECG 01/14/2020   Chest pain 09/11/2018   Class 1 obesity with body mass index (BMI) of 34.0 to 34.9 in adult 03/13/2018    Past Surgical History:  Procedure Laterality Date   CIRCUMCISION     MANDIBLE FRACTURE SURGERY  2010     Current Outpatient Medications:    gabapentin (NEURONTIN) 300 MG capsule, Take 1 capsule (300 mg total) by mouth 2 (two) times daily. (Patient not taking: Reported on 04/26/2022), Disp: 60 capsule, Rfl: 0   oxyCODONE (ROXICODONE) 5 MG immediate release tablet, Take 1 tablet (5 mg total) by mouth every 4 (four) hours as needed for severe pain. (Patient not taking: Reported on 04/26/2022), Disp: 15 tablet, Rfl: 0  No Known Allergies  Patient Care Team: Delsa Grana, PA-C as PCP - General (Family Medicine)   Chart Review: I personally reviewed active problem list, medication list, allergies, family history,  social history, health maintenance, notes from last encounter, lab results, imaging with the patient/caregiver today.   Review of Systems  Constitutional: Negative.   HENT: Negative.    Eyes: Negative.   Respiratory: Negative.    Cardiovascular: Negative.   Gastrointestinal: Negative.   Endocrine: Negative.   Genitourinary: Negative.   Musculoskeletal: Negative.   Skin: Negative.   Allergic/Immunologic: Negative.   Neurological: Negative.   Hematological: Negative.   Psychiatric/Behavioral: Negative.    All other systems reviewed and are negative.         Objective:   Vitals:  Vitals:   04/26/22 0811  BP: 114/76  Pulse: 73  Resp: 16  Temp: 97.8 F (36.6 C)  TempSrc: Oral  SpO2: 98%  Weight: 257 lb 14.4 oz (117 kg)  Height: '6\' 2"'$  (1.88 m)    Body mass index is 33.11 kg/m.  Physical Exam Vitals and nursing note reviewed.  Constitutional:      General: He is not in acute distress.    Appearance: Normal appearance. He is well-developed, well-groomed and overweight. He is not ill-appearing, toxic-appearing or diaphoretic.  HENT:     Head: Normocephalic and atraumatic.     Jaw: No trismus.     Right Ear: Tympanic membrane, ear canal and external ear normal.     Left Ear: Tympanic membrane, ear canal and external ear normal.     Nose: Nose normal. No mucosal edema, congestion or rhinorrhea.     Right Sinus: No maxillary sinus tenderness or frontal sinus tenderness.     Left Sinus: No maxillary sinus tenderness or frontal sinus tenderness.     Mouth/Throat:     Pharynx: Uvula midline. No oropharyngeal exudate, posterior oropharyngeal erythema or uvula swelling.  Eyes:     General: Lids are normal. No scleral icterus.       Right eye:  No discharge.        Left eye: No discharge.     Conjunctiva/sclera: Conjunctivae normal.  Neck:     Trachea: Trachea and phonation normal. No tracheal deviation.  Cardiovascular:     Rate and Rhythm: Normal rate and regular  rhythm.     Pulses: Normal pulses.          Radial pulses are 2+ on the right side and 2+ on the left side.       Posterior tibial pulses are 2+ on the right side and 2+ on the left side.     Heart sounds: Normal heart sounds. No murmur heard.    No friction rub. No gallop.  Pulmonary:     Effort: Pulmonary effort is normal. No respiratory distress.     Breath sounds: Normal breath sounds. No stridor. No wheezing, rhonchi or rales.  Abdominal:     General: Bowel sounds are normal. There is no distension.     Palpations: Abdomen is soft.     Tenderness: There is no abdominal tenderness. There is no guarding or rebound.  Musculoskeletal:     Cervical back: Normal range of motion and neck supple.     Right lower leg: No edema.     Left lower leg: No edema.  Skin:    General: Skin is warm and dry.     Capillary Refill: Capillary refill takes less than 2 seconds.     Coloration: Skin is not jaundiced or pale.     Findings: No lesion or rash.  Neurological:     Mental Status: He is alert. Mental status is at baseline.     Gait: Gait normal.  Psychiatric:        Mood and Affect: Mood normal.        Speech: Speech normal.        Behavior: Behavior normal. Behavior is cooperative.      No results found for this or any previous visit (from the past 2160 hour(s)).  Fall Risk:    04/26/2022    8:21 AM 06/18/2021    2:53 PM 03/26/2021   11:20 AM 12/16/2020    1:32 PM 01/14/2020    2:15 PM  Fall Risk   Falls in the past year? 0 0 0 0 0  Number falls in past yr: 0 0 0 0 0  Injury with Fall? 0 0 0 0 0  Risk for fall due to : No Fall Risks   No Fall Risks   Follow up Falls prevention discussed;Education provided;Falls evaluation completed Falls evaluation completed  Falls prevention discussed Falls evaluation completed    Functional Status Survey: Is the patient deaf or have difficulty hearing?: No Does the patient have difficulty seeing, even when wearing glasses/contacts?: No Does  the patient have difficulty concentrating, remembering, or making decisions?: No Does the patient have difficulty walking or climbing stairs?: No Does the patient have difficulty dressing or bathing?: No Does the patient have difficulty doing errands alone such as visiting a doctor's office or shopping?: No   Assessment & Plan:    CPE completed today  Prostate cancer screening and PSA options (with potential risks and benefits of testing vs not testing) were discussed along with recent recs/guidelines, shared decision making and handout/information given to pt today  USPSTF grade A and B recommendations reviewed with patient; age-appropriate recommendations, preventive care, screening tests, etc discussed and encouraged; healthy living encouraged; see AVS for patient education given to patient  Discussed  importance of 150 minutes of physical activity weekly, AHA exercise recommendations given to pt in AVS/handout  Discussed importance of healthy diet:  eating lean meats and proteins, avoiding trans fats and saturated fats, avoid simple sugars and excessive carbs in diet, eat 6 servings of fruit/vegetables daily and drink plenty of water and avoid sweet beverages.  DASH diet reviewed if pt has HTN  Recommended pt to do annual eye exam and routine dental exams/cleanings  Advance Care planning information and packet discussed and offered today, encouraged pt to discuss with family members/spouse/partner/friends and complete Advanced directive packet and bring copy to office   Reviewed Health Maintenance: Health Maintenance  Topic Date Due   Hepatitis C Screening  Never done   COVID-19 Vaccine (1) 05/12/2022 (Originally 06/24/1993)   INFLUENZA VACCINE  05/22/2022 (Originally 09/21/2021)   DTaP/Tdap/Td (2 - Td or Tdap) 06/17/2031   HIV Screening  Completed   HPV VACCINES  Aged Out    Immunizations: Immunization History  Administered Date(s) Administered   Tdap 06/16/2021         ICD-10-CM   1. Annual physical exam  Z00.00 CBC with Differential/Platelet    COMPLETE METABOLIC PANEL WITH GFR    Lipid panel    Hemoglobin A1c    2. Need for hepatitis C screening test  Z11.59 Hepatitis C antibody    3. Screening for STD (sexually transmitted disease)  Z11.3 HIV Antibody (routine testing w rflx)    RPR    Urine cytology ancillary only          Delsa Grana, PA-C 04/26/22 8:46 AM  [facility]

## 2022-04-27 LAB — LIPID PANEL
Cholesterol: 151 mg/dL (ref <200)
HDL: 48 mg/dL (ref >=40)
LDL Cholesterol (Calc): 88 mg/dL (calc)
Non-HDL Cholesterol (Calc): 103 mg/dL (calc) (ref <130)
Total CHOL/HDL Ratio: 3.1 (calc) (ref <5.0)
Triglycerides: 66 mg/dL (ref <150)

## 2022-04-27 LAB — CBC WITH DIFFERENTIAL/PLATELET
Absolute Monocytes: 259 cells/uL (ref 200–950)
Basophils Absolute: 28 cells/uL (ref 0–200)
Basophils Relative: 0.6 %
Eosinophils Absolute: 28 cells/uL (ref 15–500)
Eosinophils Relative: 0.6 %
HCT: 45.2 % (ref 38.5–50.0)
Hemoglobin: 15.5 g/dL (ref 13.2–17.1)
Lymphs Abs: 2345 cells/uL (ref 850–3900)
MCH: 28.7 pg (ref 27.0–33.0)
MCHC: 34.3 g/dL (ref 32.0–36.0)
MCV: 83.7 fL (ref 80.0–100.0)
MPV: 11.3 fL (ref 7.5–12.5)
Monocytes Relative: 5.5 %
Neutro Abs: 2040 cells/uL (ref 1500–7800)
Neutrophils Relative %: 43.4 %
Platelets: 239 10*3/uL (ref 140–400)
RBC: 5.4 10*6/uL (ref 4.20–5.80)
RDW: 13.2 % (ref 11.0–15.0)
Total Lymphocyte: 49.9 %
WBC: 4.7 10*3/uL (ref 3.8–10.8)

## 2022-04-27 LAB — RPR: RPR Ser Ql: NONREACTIVE

## 2022-04-27 LAB — COMPLETE METABOLIC PANEL WITH GFR
AG Ratio: 1.3 (calc) (ref 1.0–2.5)
ALT: 35 U/L (ref 9–46)
AST: 20 U/L (ref 10–40)
Albumin: 4.5 g/dL (ref 3.6–5.1)
Alkaline phosphatase (APISO): 87 U/L (ref 36–130)
BUN: 15 mg/dL (ref 7–25)
CO2: 26 mmol/L (ref 20–32)
Calcium: 9.8 mg/dL (ref 8.6–10.3)
Chloride: 107 mmol/L (ref 98–110)
Creat: 1.02 mg/dL (ref 0.60–1.24)
Globulin: 3.4 g/dL (calc) (ref 1.9–3.7)
Glucose, Bld: 84 mg/dL (ref 65–99)
Potassium: 5 mmol/L (ref 3.5–5.3)
Sodium: 142 mmol/L (ref 135–146)
Total Bilirubin: 0.3 mg/dL (ref 0.2–1.2)
Total Protein: 7.9 g/dL (ref 6.1–8.1)
eGFR: 102 mL/min/{1.73_m2} (ref >=60)

## 2022-04-27 LAB — URINE CYTOLOGY ANCILLARY ONLY
Chlamydia: NEGATIVE
Comment: NEGATIVE
Comment: NEGATIVE
Comment: NORMAL
Neisseria Gonorrhea: NEGATIVE
Trichomonas: NEGATIVE

## 2022-04-27 LAB — HEMOGLOBIN A1C
Hgb A1c MFr Bld: 5.6 % of total Hgb (ref <5.7)
Mean Plasma Glucose: 114 mg/dL
eAG (mmol/L): 6.3 mmol/L

## 2022-04-27 LAB — HIV ANTIBODY (ROUTINE TESTING W REFLEX): HIV 1&2 Ab, 4th Generation: NONREACTIVE

## 2022-04-27 LAB — HEPATITIS C ANTIBODY: Hepatitis C Ab: NONREACTIVE

## 2022-05-23 DIAGNOSIS — Z419 Encounter for procedure for purposes other than remedying health state, unspecified: Secondary | ICD-10-CM | POA: Diagnosis not present

## 2022-06-22 DIAGNOSIS — Z419 Encounter for procedure for purposes other than remedying health state, unspecified: Secondary | ICD-10-CM | POA: Diagnosis not present

## 2022-07-23 DIAGNOSIS — Z419 Encounter for procedure for purposes other than remedying health state, unspecified: Secondary | ICD-10-CM | POA: Diagnosis not present

## 2022-08-22 DIAGNOSIS — Z419 Encounter for procedure for purposes other than remedying health state, unspecified: Secondary | ICD-10-CM | POA: Diagnosis not present

## 2022-09-11 IMAGING — DX DG HAND COMPLETE 3+V*R*
3 series · 3 of 3 positions shown · non-contrast
Comparison: None.

CLINICAL DATA: Right hand laceration, trauma

EXAM:
RIGHT HAND - COMPLETE 3+ VIEW

[hand pa]
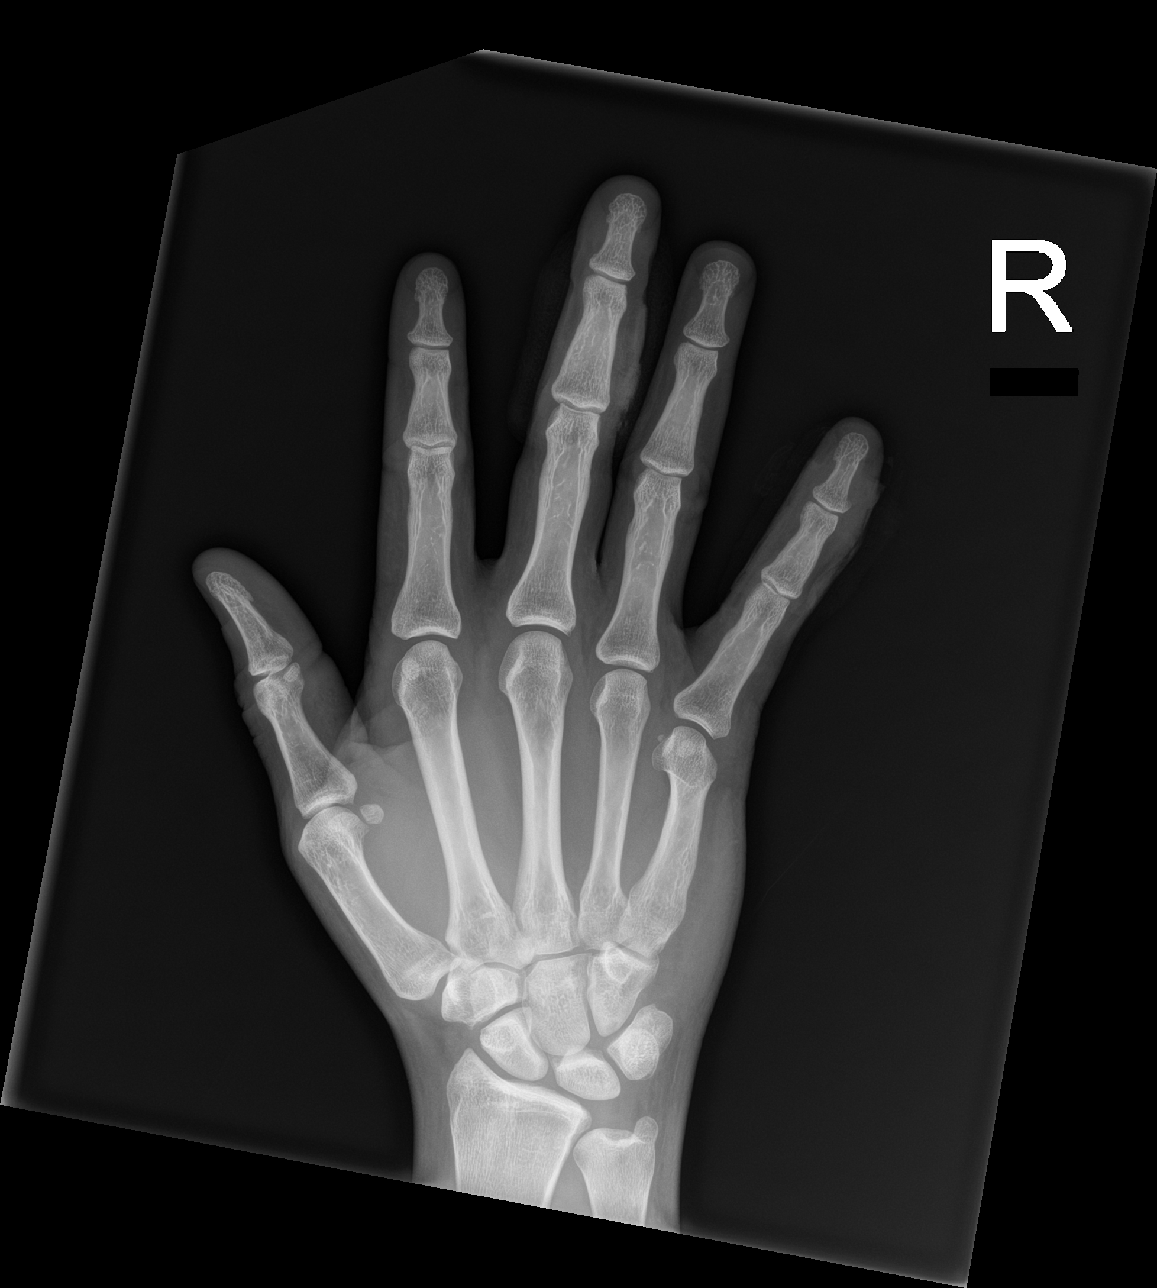

[hand obl]
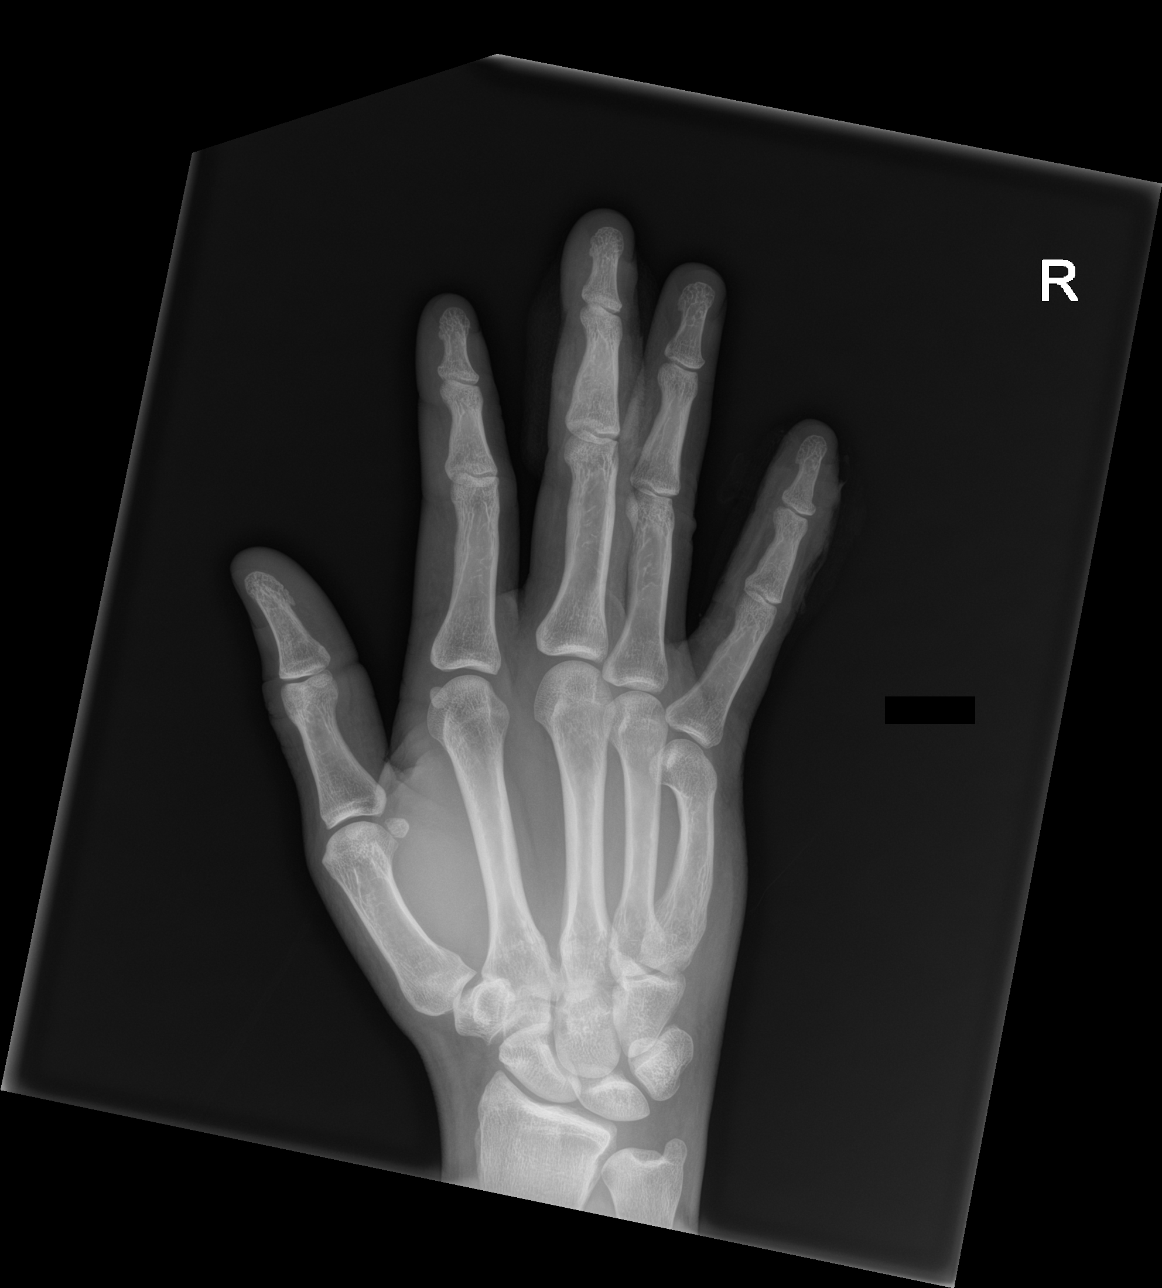

[hand lat]
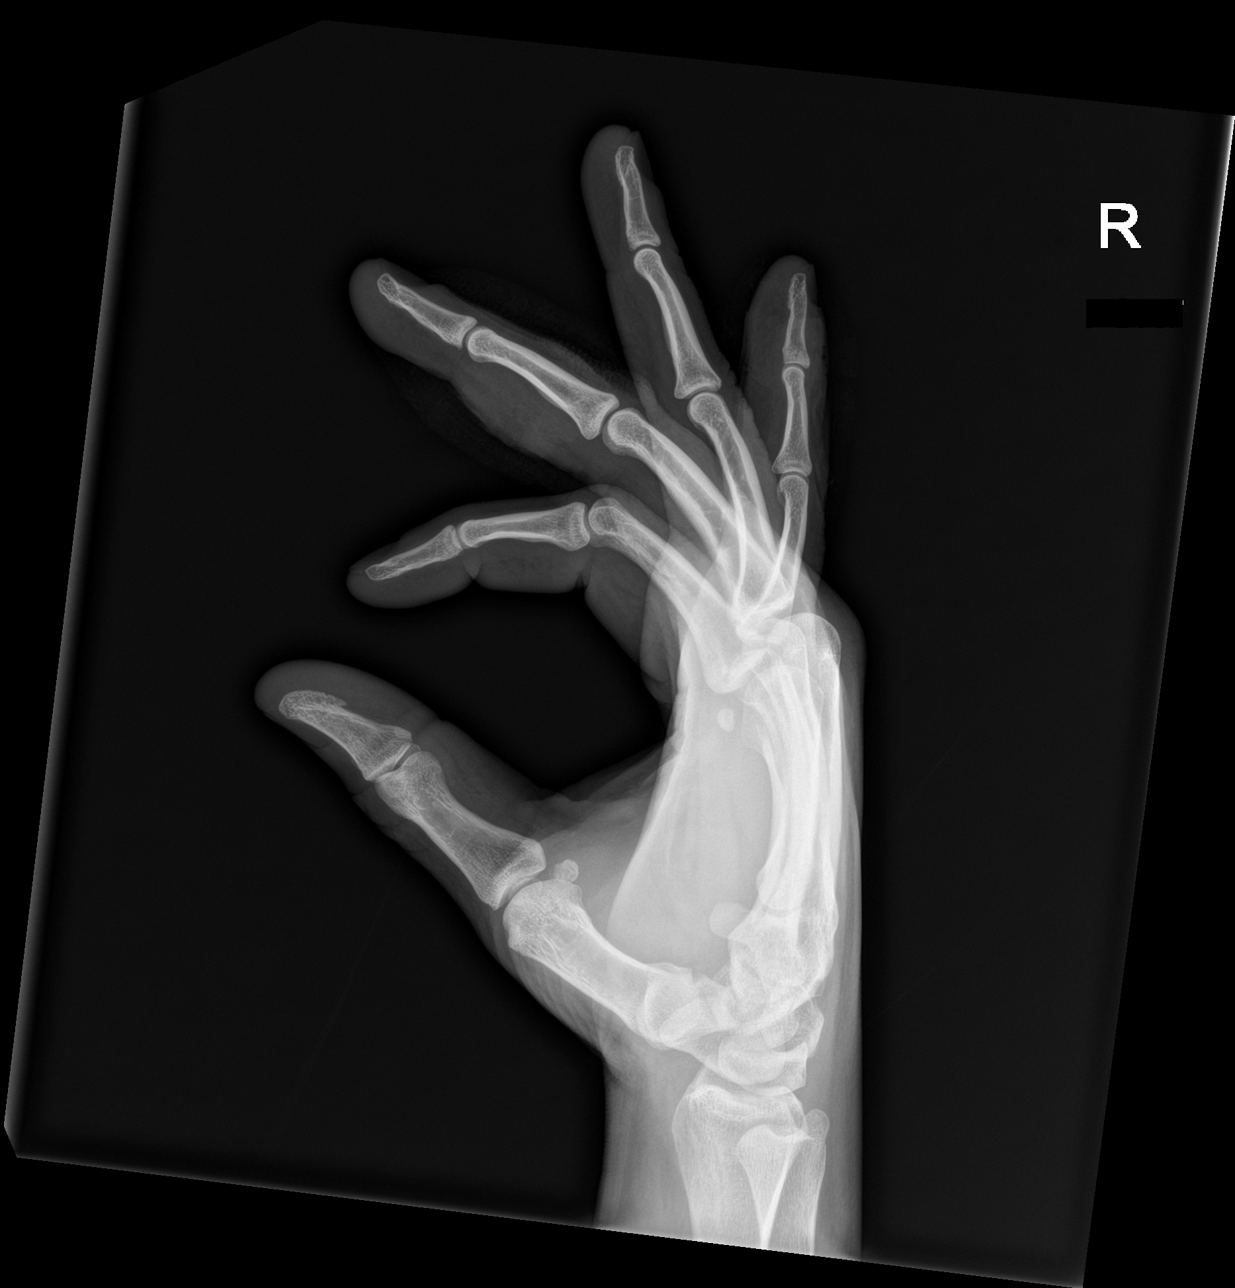

[3 of 3 positions shown; findings below may reference images not displayed]

FINDINGS: Soft tissue injury of the right third and fifth digits compatible
with lacerations. No underlying acute osseous finding, malalignment,
or definite radiopaque foreign body. No joint abnormality. Healed
deformity of the right fifth metacarpal from a remote boxer's
fracture.
IMPRESSION: Soft tissue injury/lacerations.  No acute osseous finding.

## 2022-09-22 DIAGNOSIS — Z419 Encounter for procedure for purposes other than remedying health state, unspecified: Secondary | ICD-10-CM | POA: Diagnosis not present

## 2022-10-12 ENCOUNTER — Other Ambulatory Visit: Payer: Self-pay

## 2022-10-12 ENCOUNTER — Emergency Department
Admission: EM | Admit: 2022-10-12 | Discharge: 2022-10-12 | Disposition: A | Discharge status: Home / Self Care | Payer: Medicaid Other | Attending: Emergency Medicine | Admitting: Emergency Medicine

## 2022-10-12 ENCOUNTER — Encounter: Payer: Self-pay | Admitting: Emergency Medicine

## 2022-10-12 DIAGNOSIS — H60331 Swimmer's ear, right ear: Secondary | ICD-10-CM | POA: Diagnosis not present

## 2022-10-12 DIAGNOSIS — H9201 Otalgia, right ear: Secondary | ICD-10-CM | POA: Diagnosis present

## 2022-10-12 MED ORDER — OFLOXACIN 0.3 % OT SOLN: 5.0000 [drp] | Freq: Every day | OTIC | 0 refills | Status: DC

## 2022-10-12 NOTE — ED Triage Notes (Signed)
Pt presents ambulatory to triage via POV with complaints of R ear pain x 1 day. Endorses pressure and throbbing in his ear. No hearing loss or ringing. A&Ox4 at this time. Denies cough, fevers, chills, CP or SOB.

## 2022-10-12 NOTE — ED Provider Notes (Signed)
   [facility] Provider Note    Event Date/Time   First MD Initiated Contact with Patient 10/12/22 2034     (approximate)   History   Otalgia   HPI  Mathew Doyle is a 32 y.o. male who presents with complaints of pain in his right ear.  He does admit to swimming about 1 week ago.     Physical Exam   Triage Vital Signs: ED Triage Vitals  Encounter Vitals Group     BP 10/12/22 2008 131/83     Systolic BP Percentile --      Diastolic BP Percentile --      Pulse Rate 10/12/22 2008 60     Resp 10/12/22 2008 18     Temp 10/12/22 2008 98.7 F (37.1 C)     Temp Source 10/12/22 2008 Oral     SpO2 10/12/22 2008 100 %     Weight 10/12/22 2007 108.9 kg (240 lb)     Height 10/12/22 2007 1.88 m (6\' 2" )     Head Circumference --      Peak Flow --      Pain Score 10/12/22 2007 9     Pain Loc --      Pain Education --      Exclude from Growth Chart --     Most recent vital signs: Vitals:   10/12/22 2008  BP: 131/83  Pulse: 60  Resp: 18  Temp: 98.7 F (37.1 C)  SpO2: 100%     General: Awake, no distress.  CV:  Good peripheral perfusion.  Resp:  Normal effort.  Abd:  No distention.  Other:  Right ear: Consistent with otitis externa, TM intact, no bulging or redness   ED Results / Procedures / Treatments   Labs (all labs ordered are listed, but only abnormal results are displayed) Labs Reviewed - No data to display   EKG     RADIOLOGY     PROCEDURES:  Critical Care performed:   Procedures   MEDICATIONS ORDERED IN ED: Medications - No data to display   IMPRESSION / MDM / ASSESSMENT AND PLAN / ED COURSE  I reviewed the triage vital signs and the nursing notes. Patient's presentation is most consistent with acute, uncomplicated illness.   Exam is consistent with otitis externa, ofloxacin prescription provided, recommend over-the-counter medications for pain       FINAL CLINICAL IMPRESSION(S) / ED DIAGNOSES   Final  diagnoses:  Acute swimmer's ear of right side     Rx / DC Orders   ED Discharge Orders          Ordered    ofloxacin (FLOXIN) 0.3 % OTIC solution  Daily        10/12/22 2051             Note:  This document was prepared using Dragon voice recognition software and may include unintentional dictation errors.   Jene Every, MD 10/12/22 2137

## 2022-10-23 DIAGNOSIS — Z419 Encounter for procedure for purposes other than remedying health state, unspecified: Secondary | ICD-10-CM | POA: Diagnosis not present

## 2022-11-22 DIAGNOSIS — Z419 Encounter for procedure for purposes other than remedying health state, unspecified: Secondary | ICD-10-CM | POA: Diagnosis not present

## 2022-11-23 ENCOUNTER — Ambulatory Visit: Payer: Self-pay | Admitting: *Deleted

## 2022-11-23 NOTE — Telephone Encounter (Signed)
Pt's wife is calling in because pt says when he sneezes his collar bone hurts from left to right. No other symptoms but the pain in the collar bone

## 2022-11-23 NOTE — Telephone Encounter (Signed)
Routing to practice after attempt to reach pt. For PCPs review. Please advise

## 2022-11-24 ENCOUNTER — Ambulatory Visit: Payer: Medicaid Other | Admitting: Internal Medicine

## 2022-11-24 NOTE — Telephone Encounter (Signed)
Spoke to wife and she will call pt to see when he is available for an appt. Pt may be scheduled with anyone with a opening due to leisa being out

## 2022-11-30 ENCOUNTER — Ambulatory Visit: Payer: Medicaid Other | Admitting: Nurse Practitioner

## 2022-12-23 DIAGNOSIS — Z419 Encounter for procedure for purposes other than remedying health state, unspecified: Secondary | ICD-10-CM | POA: Diagnosis not present

## 2023-01-22 DIAGNOSIS — Z419 Encounter for procedure for purposes other than remedying health state, unspecified: Secondary | ICD-10-CM | POA: Diagnosis not present

## 2023-02-22 DIAGNOSIS — Z419 Encounter for procedure for purposes other than remedying health state, unspecified: Secondary | ICD-10-CM | POA: Diagnosis not present

## 2023-03-25 DIAGNOSIS — Z419 Encounter for procedure for purposes other than remedying health state, unspecified: Secondary | ICD-10-CM | POA: Diagnosis not present

## 2023-04-22 DIAGNOSIS — Z419 Encounter for procedure for purposes other than remedying health state, unspecified: Secondary | ICD-10-CM | POA: Diagnosis not present

## 2023-06-03 DIAGNOSIS — Z419 Encounter for procedure for purposes other than remedying health state, unspecified: Secondary | ICD-10-CM | POA: Diagnosis not present

## 2023-07-03 DIAGNOSIS — Z419 Encounter for procedure for purposes other than remedying health state, unspecified: Secondary | ICD-10-CM | POA: Diagnosis not present

## 2023-08-03 DIAGNOSIS — Z419 Encounter for procedure for purposes other than remedying health state, unspecified: Secondary | ICD-10-CM | POA: Diagnosis not present

## 2023-09-02 DIAGNOSIS — Z419 Encounter for procedure for purposes other than remedying health state, unspecified: Secondary | ICD-10-CM | POA: Diagnosis not present

## 2023-09-18 ENCOUNTER — Encounter: Payer: Self-pay | Admitting: Emergency Medicine

## 2023-09-18 ENCOUNTER — Ambulatory Visit
Admission: EM | Admit: 2023-09-18 | Discharge: 2023-09-18 | Disposition: A | Discharge status: Home / Self Care | Attending: Emergency Medicine | Admitting: Emergency Medicine

## 2023-09-18 ENCOUNTER — Ambulatory Visit: Admitting: Internal Medicine

## 2023-09-18 DIAGNOSIS — H5712 Ocular pain, left eye: Secondary | ICD-10-CM | POA: Diagnosis not present

## 2023-09-18 MED ORDER — KETOROLAC TROMETHAMINE 0.5 % OP SOLN: 1.0000 [drp] | Freq: Four times a day (QID) | OPHTHALMIC | 0 refills | Status: DC

## 2023-09-18 MED ORDER — MOXIFLOXACIN HCL 0.5 % OP SOLN: 1.0000 [drp] | Freq: Three times a day (TID) | OPHTHALMIC | 0 refills | Status: DC

## 2023-09-18 NOTE — Discharge Instructions (Signed)
 Your evaluated for eye pain, on exam no corneal abrasion was visualized at this time, unknown cause for your symptoms but we will move forward with treatment as such  Place 1 drop into the left eye every 8 hours for 7 days to prevent eye from becoming infected  Place 1 drop of ketorolac  into the eye every 6 hours as needed for pain  Avoid eye rubbing and touching as this can cause further irritation  When administering medications, wait at least 5 minutes between medicines so that they do not mix  For itching May use allergy medicine such as Claritin or Zyrtec, may also purchase Pataday eyedrops as needed  If no improvement in symptoms within 48 hours please reach out to ophthalmology clinic above for further evaluation and management

## 2023-09-18 NOTE — ED Provider Notes (Signed)
 CAY RALPH PELT    CSN: [id number] Arrival date & time: 09/18/23  1240      History   Chief Complaint Chief Complaint  Patient presents with   Eye Pain    HPI Mathew Doyle is a 33 y.o. male.   Patient presents for evaluation of left eye pain, increased tingling and difficulty keeping the eye open beginning 1 day ago.  Was completing housework prior to symptoms beginning, possible dust exposure but denies injury or trauma.  Endorses light sensitivity but denies blurriness.  Associated mild itching.  Denies purulent drainage.  Denies use of contacts.  Has attempted over-the-counter dry eye relief which has been ineffective.  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 Authorizing Provider  ketorolac  (ACULAR ) 0.5 % ophthalmic solution Place 1 drop into both eyes every 6 (six) hours. 09/18/23  Yes Maigen Mozingo R, NP  moxifloxacin  (VIGAMOX ) 0.5 % ophthalmic solution Place 1 drop into the left eye 3 (three) times daily. 09/18/23  Yes Bettyjean Stefanski R, NP  ofloxacin  (FLOXIN ) 0.3 % OTIC solution Place 5 drops into the right ear daily. 10/12/22   Arlander Charleston, MD    Family History Family History  Problem Relation Age of Onset   Long QT syndrome Father    Heart disease Father    Prostate cancer Maternal Grandfather     Social History Social History   Tobacco Use   Smoking status: Never    Smokeless tobacco: Never  Vaping Use   Vaping status: Every Day  Substance Use Topics   Alcohol use: Yes    Comment: occasional   Drug use: Never     Allergies   Patient has no known allergies.   Review of Systems Review of Systems   Physical Exam Triage Vital Signs ED Triage Vitals  Encounter Vitals Group     BP 09/18/23 1300 108/70     Girls Systolic BP Percentile --      Girls Diastolic BP Percentile --      Boys Systolic BP Percentile --      Boys Diastolic BP Percentile --      Pulse Rate 09/18/23 1300 65     Resp 09/18/23 1300 18     Temp 09/18/23 1300 98.5 F (36.9 C)     Temp Source 09/18/23 1300 Oral     SpO2 09/18/23 1300 98 %     Weight --      Height --      Head Circumference --      Peak Flow --      Pain Score 09/18/23 1306 8     Pain Loc --      Pain Education --      Exclude from Growth Chart --    No data found.  Updated Vital Signs BP 108/70 (BP Location: Right Arm)   Pulse 65   Temp 98.5 F (36.9 C) (Oral)   Resp 18   SpO2 98%   Visual Acuity Right Eye Distance:   Left Eye Distance:   Bilateral Distance:    Right Eye Near:   Left Eye Near:    Bilateral Near:     Physical Exam Constitutional:      Appearance: Normal appearance.  Eyes:     Comments: Erythema present to the left eye, no drainage noted on exam, increased tearing and difficulty keeping eye open, no corneal abrasion  Pulmonary:     Effort: Pulmonary effort is normal.  Neurological:     Mental Status: He is alert and oriented to person, place, and time.      UC Treatments / Results  Labs (all labs ordered are listed, but only abnormal results are displayed) Labs Reviewed - No data to display  EKG   Radiology No results found.  Procedures Procedures (including critical care time)  Medications Ordered in UC Medications - No data to display  Initial Impression / Assessment and Plan / UC Course  I have reviewed the triage vital signs and the nursing  notes.  Pertinent labs & imaging results that were available during my care of the patient were reviewed by me and considered in my medical decision making (see chart for details).  Left eye pain  No abrasion noted, unknown etiology, denies injury, unable to assess visual acuity as he cannot tolerate keeping eye open, visual tracking and extraocular movements intact, reactive to light, prescribed moxifloxacin  and ketorolac , advised against eye rubbing and touching and recommended supportive care for management of pruritus, and given walking referral to ophthalmology if no improvement seen within 48 hours she is to follow-up for reevaluation Final Clinical Impressions(s) / UC Diagnoses   Final diagnoses:  Left eye pain     Discharge Instructions      Your evaluated for eye pain, on exam no corneal abrasion was visualized at this time, unknown cause for your symptoms but we will move forward with treatment as such  Place 1 drop into the left eye every 8 hours for 7 days to prevent eye from becoming infected  Place 1 drop of ketorolac  into the eye every 6 hours as needed for pain  Avoid eye rubbing and touching as this can cause further irritation  When administering medications, wait at least 5 minutes between medicines so that they do not mix  For itching May use allergy medicine such as Claritin or Zyrtec, may also purchase Pataday eyedrops as needed  If no improvement in symptoms within 48 hours please reach out to ophthalmology clinic above for further evaluation and management   ED Prescriptions     Medication Sig Dispense Auth. Provider   moxifloxacin  (VIGAMOX ) 0.5 % ophthalmic solution Place 1 drop into the left eye 3 (three) times daily. 3 mL Nedim Oki R, NP   ketorolac  (ACULAR ) 0.5 % ophthalmic solution Place 1 drop into both eyes every 6 (six) hours. 5 mL Teresa Shelba SAUNDERS, NP      PDMP not reviewed this encounter.   Teresa Shelba SAUNDERS, [place] 09/18/23 [phone]

## 2023-09-18 NOTE — ED Triage Notes (Signed)
 Patient reports left eye pain that's watery that started yesterday.  Denies injury. Rates 8/10. Patient was using dry relief eye drops with no relief. Patient was unable to tolerate visual acuity test due to bright lights.

## 2023-10-03 DIAGNOSIS — Z419 Encounter for procedure for purposes other than remedying health state, unspecified: Secondary | ICD-10-CM | POA: Diagnosis not present

## 2023-11-02 ENCOUNTER — Encounter (HOSPITAL_COMMUNITY): Payer: Self-pay

## 2023-11-02 ENCOUNTER — Emergency Department (HOSPITAL_COMMUNITY)
Admission: EM | Admit: 2023-11-02 | Discharge: 2023-11-03 | Discharge status: Against Medical Advice | Attending: Emergency Medicine | Admitting: Emergency Medicine

## 2023-11-02 ENCOUNTER — Emergency Department (HOSPITAL_COMMUNITY)

## 2023-11-02 DIAGNOSIS — R0789 Other chest pain: Secondary | ICD-10-CM | POA: Diagnosis not present

## 2023-11-02 DIAGNOSIS — Z5321 Procedure and treatment not carried out due to patient leaving prior to being seen by health care provider: Secondary | ICD-10-CM | POA: Diagnosis not present

## 2023-11-02 DIAGNOSIS — R0602 Shortness of breath: Secondary | ICD-10-CM | POA: Diagnosis not present

## 2023-11-02 LAB — BASIC METABOLIC PANEL WITH GFR
Anion gap: 13 (ref 5–15)
BUN: 16 mg/dL (ref 6–20)
CO2: 21 mmol/L — ABNORMAL LOW (ref 22–32)
Calcium: 9.3 mg/dL (ref 8.9–10.3)
Chloride: 106 mmol/L (ref 98–111)
Creatinine, Ser: 1.05 mg/dL (ref 0.61–1.24)
GFR, Estimated: >60 mL/min (ref >60)
Glucose, Bld: 96 mg/dL (ref 70–99)
Potassium: 4 mmol/L (ref 3.5–5.1)
Sodium: 140 mmol/L (ref 135–145)

## 2023-11-02 LAB — TROPONIN T, HIGH SENSITIVITY: Troponin T High Sensitivity: <15 ng/L (ref 0–19)

## 2023-11-02 LAB — CBC
HCT: 42.3 % (ref 39.0–52.0)
Hemoglobin: 14.7 g/dL (ref 13.0–17.0)
MCH: 29.1 pg (ref 26.0–34.0)
MCHC: 34.8 g/dL (ref 30.0–36.0)
MCV: 83.6 fL (ref 80.0–100.0)
Platelets: 229 K/uL (ref 150–400)
RBC: 5.06 MIL/uL (ref 4.22–5.81)
RDW: 13.2 % (ref 11.5–15.5)
WBC: 6.4 K/uL (ref 4.0–10.5)
nRBC: 0 % (ref 0.0–0.2)

## 2023-11-02 NOTE — ED Triage Notes (Signed)
 Patient arrived with complaints of chest tightness, states it feels like he can't get a good deep breath in.

## 2023-11-03 DIAGNOSIS — Z419 Encounter for procedure for purposes other than remedying health state, unspecified: Secondary | ICD-10-CM | POA: Diagnosis not present

## 2024-01-03 DIAGNOSIS — Z419 Encounter for procedure for purposes other than remedying health state, unspecified: Secondary | ICD-10-CM | POA: Diagnosis not present

## 2024-01-29 DIAGNOSIS — S60221A Contusion of right hand, initial encounter: Secondary | ICD-10-CM | POA: Diagnosis not present

## 2024-01-29 DIAGNOSIS — S6991XA Unspecified injury of right wrist, hand and finger(s), initial encounter: Secondary | ICD-10-CM | POA: Diagnosis not present

## 2024-01-29 DIAGNOSIS — M79641 Pain in right hand: Secondary | ICD-10-CM | POA: Diagnosis not present

## 2024-01-29 DIAGNOSIS — X58XXXA Exposure to other specified factors, initial encounter: Secondary | ICD-10-CM | POA: Diagnosis not present

## 2024-03-11 ENCOUNTER — Ambulatory Visit: Payer: Self-pay

## 2024-03-11 NOTE — Telephone Encounter (Signed)
 FYI Only or Action Required?: FYI only for provider: appointment scheduled on tomorrow morning.  Patient was last seen in primary care on 04/26/2022 by Tapia, Leisa, PA-C.  Called Nurse Triage reporting Back Pain.  Symptoms began a week ago.  Interventions attempted: OTC medications: Tylenol .  Symptoms are: unchanged.  Triage Disposition: See PCP When Office is Open (Within 3 Days)  Patient/caregiver understands and will follow disposition?: Yes                     Reason for Triage: Severe Back pain, for a couple days    Reason for Disposition  [1] MODERATE back pain (e.g., interferes with normal activities) AND [2] present > 3 days  Answer Assessment - Initial Assessment Questions 1. ONSET: When did the pain begin? (e.g., minutes, hours, days)     Last week 2. LOCATION: Where does it hurt? (upper, mid or lower back)     Lower whole lower back  3. SEVERITY: How bad is the pain?  (e.g., Scale 1-10; mild, moderate, or severe)     10/10 4. PATTERN: Is the pain constant? (e.g., yes, no; constant, intermittent)      constant 5. RADIATION: Does the pain shoot into your legs or somewhere else?     unsure 6. CAUSE:  What do you think is causing the back pain?      no 7. BACK OVERUSE:  Any recent lifting of heavy objects, strenuous work or exercise?     He does use his back  8. MEDICINES: What have you taken so far for the pain? (e.g., nothing, acetaminophen , NSAIDS)     Tylenol  9. NEUROLOGIC SYMPTOMS: Do you have any weakness, numbness, or problems with bowel/bladder control?     no 10. OTHER SYMPTOMS: Do you have any other symptoms? (e.g., fever, abdomen pain, burning with urination, blood in urine)       no  Protocols used: Back Pain-A-AH

## 2024-03-12 ENCOUNTER — Encounter: Payer: Self-pay | Admitting: Family Medicine

## 2024-03-12 ENCOUNTER — Ambulatory Visit: Admitting: Family Medicine

## 2024-03-12 ENCOUNTER — Telehealth: Payer: Self-pay | Admitting: Family Medicine

## 2024-03-12 VITALS — BP 118/84 | HR 80 | Resp 16 | Ht 74.0 in | Wt 256.0 lb

## 2024-03-12 DIAGNOSIS — M545 Low back pain, unspecified: Secondary | ICD-10-CM | POA: Diagnosis not present

## 2024-03-12 MED ORDER — CELECOXIB 200 MG PO CAPS: 200.0000 mg | ORAL_CAPSULE | Freq: Two times a day (BID) | ORAL | 1 refills | Status: AC | PRN

## 2024-03-12 MED ORDER — TIZANIDINE HCL 4 MG PO TABS: 4.0000 mg | ORAL_TABLET | Freq: Three times a day (TID) | ORAL | 0 refills | Status: AC | PRN

## 2024-03-12 NOTE — Telephone Encounter (Signed)
 Copied from CRM [id number]. Topic: Clinical - Medication Question >> Mar 12, 2024 11:18 AM Mathew Doyle wrote: Reason for CRM: Pt has questions about her celecoxib  (CELEBREX ) 200 MG capsule  and tiZANidine  (ZANAFLEX ) 4 MG tablet. The pt is requesting a callback at  [phone]

## 2024-03-12 NOTE — Telephone Encounter (Signed)
 Pt contacted wanted to know about side effect of dizziness.  Told pt it is a side effect but does not mean it will happen.

## 2024-03-12 NOTE — Progress Notes (Signed)
" ° °  Acute Office Visit  Subjective:     Patient ID: Mathew Doyle, male    DOB: 05/09/1990, 34 y.o.   MRN: [id number]  Chief Complaint  Patient presents with   Back Pain    Constant x1 week, lower back.    HPI Patient is in today for complaints of low back pain. He is a new patient to me. He is accompanied by his wife to today's visit. He describes pain to be constant. He describes pain to wax and wane. He states yesterday pain was really bad, stating pain scale of a 10. He voices pain is a 8 on a 1 to 10 scale. He denies known injuries, falls or trauma. 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 He voices everything makes it worse. He voices initially activity such as repositioning, stretching and walking could help but recently this has not worked. He has taken Tylenol  without relief. He has taken other OTC NSAIDs without relief. He has a prescription for Hydrocodone  5-325mg  from a doctor in [place] that he saw for hand pain. He has taken this for his back pain and this has not helped either. Denies associated numbness or tingling, lower extremity weakness or loss of bowel or bladder control.   Lumbar spine tender to palpation. Left sided low back pain tender to palpation.      Review of Systems  Neurological:  Negative for tingling.        Objective:    BP 118/84   Pulse 80   Resp 16   Ht 6' 2 (1.88 m)   Wt 256 lb (116.1 kg)   SpO2 97%   BMI 32.87 kg/m      Physical Exam Constitutional:      Appearance: Normal appearance.  Cardiovascular:     Rate and Rhythm: Normal rate and regular rhythm.  Pulmonary:     Effort: Pulmonary effort is normal.     Breath sounds: Normal breath sounds.  Skin:    General: Skin is warm and dry.  Neurological:     General: No focal deficit present.     Mental Status: He is alert.  Psychiatric:        Mood and Affect: Mood normal.        Behavior: Behavior normal.           Assessment & Plan:   Assessment & Plan Acute left-sided low back pain without sciatica See HPI for details.   -Start Celebrex  200mg  BID PRN pain. Advised that when taking Celebrex  he cannot take other OTC anti-inflammatories. He may take Tylenol  as needed. -Start Tizanidine  4mg  TID PRN pain. Advised that medication can cause drowsiness/ sleepiness so caution with driving or other activities requiring alertness -Consider topical Voltaren  gel as needed for pain -Recommended heat therapy as needed for pain -Referral to ortho for further evaluation, consider x-rays -Return in 6 weeks in office for follow up Orders:   Ambulatory referral to Orthopedic Surgery   celecoxib  (CELEBREX ) 200 MG capsule; Take 1 capsule (200 mg total) by mouth 2 (two) times daily as needed (pain).   tiZANidine  (ZANAFLEX ) 4 MG tablet; Take 1 tablet (4 mg total) by mouth every 8 (eight) hours as needed (pain).      Return in about 6 weeks (around 04/23/2024) for annual physical with fasting labs.  LAYMON LOISE CORE, FNP  "

## 2024-04-23 ENCOUNTER — Ambulatory Visit: Admitting: Family Medicine
